# Patient Record
Sex: Female | Born: 1995 | Race: Black or African American | Hispanic: No | Marital: Single | Smoking: Never smoker
Health system: Southern US, Community
[De-identification: ages and names within clinical notes are randomized; demographics above are authoritative.]

## PROBLEM LIST (undated history)

## (undated) DIAGNOSIS — Z862 Personal history of diseases of the blood and blood-forming organs and certain disorders involving the immune mechanism: Secondary | ICD-10-CM

## (undated) DIAGNOSIS — R7303 Prediabetes: Secondary | ICD-10-CM

## (undated) DIAGNOSIS — L680 Hirsutism: Secondary | ICD-10-CM

## (undated) DIAGNOSIS — E282 Polycystic ovarian syndrome: Secondary | ICD-10-CM

## (undated) DIAGNOSIS — E349 Endocrine disorder, unspecified: Secondary | ICD-10-CM

## (undated) DIAGNOSIS — J45909 Unspecified asthma, uncomplicated: Secondary | ICD-10-CM

## (undated) DIAGNOSIS — N939 Abnormal uterine and vaginal bleeding, unspecified: Secondary | ICD-10-CM

## (undated) HISTORY — DX: Abnormal uterine and vaginal bleeding, unspecified: N93.9

## (undated) HISTORY — DX: Endocrine disorder, unspecified: E34.9

## (undated) HISTORY — DX: Hirsutism: L68.0

## (undated) HISTORY — DX: Personal history of diseases of the blood and blood-forming organs and certain disorders involving the immune mechanism: Z86.2

## (undated) HISTORY — DX: Polycystic ovarian syndrome: E28.2

## (undated) HISTORY — DX: Prediabetes: R73.03

---

## 2016-04-11 DIAGNOSIS — N930 Postcoital and contact bleeding: Secondary | ICD-10-CM | POA: Insufficient documentation

## 2016-04-11 DIAGNOSIS — N912 Amenorrhea, unspecified: Secondary | ICD-10-CM | POA: Insufficient documentation

## 2016-04-11 DIAGNOSIS — N760 Acute vaginitis: Secondary | ICD-10-CM | POA: Insufficient documentation

## 2016-04-12 DIAGNOSIS — E669 Obesity, unspecified: Secondary | ICD-10-CM | POA: Insufficient documentation

## 2016-04-29 DIAGNOSIS — E282 Polycystic ovarian syndrome: Secondary | ICD-10-CM | POA: Insufficient documentation

## 2016-11-30 ENCOUNTER — Emergency Department (HOSPITAL_COMMUNITY)
Admission: EM | Admit: 2016-11-30 | Discharge: 2016-11-30 | Disposition: A | Discharge status: Home / Self Care | Payer: Self-pay | Attending: Emergency Medicine | Admitting: Emergency Medicine

## 2016-11-30 ENCOUNTER — Emergency Department (HOSPITAL_COMMUNITY): Payer: Self-pay

## 2016-11-30 ENCOUNTER — Encounter (HOSPITAL_COMMUNITY): Payer: Self-pay | Admitting: Emergency Medicine

## 2016-11-30 DIAGNOSIS — F410 Panic disorder [episodic paroxysmal anxiety] without agoraphobia: Secondary | ICD-10-CM | POA: Insufficient documentation

## 2016-11-30 DIAGNOSIS — J45909 Unspecified asthma, uncomplicated: Secondary | ICD-10-CM | POA: Insufficient documentation

## 2016-11-30 DIAGNOSIS — Z79899 Other long term (current) drug therapy: Secondary | ICD-10-CM | POA: Insufficient documentation

## 2016-11-30 HISTORY — DX: Unspecified asthma, uncomplicated: J45.909

## 2016-11-30 LAB — CBC
HEMATOCRIT: 33.5 % — AB (ref 36.0–46.0)
HEMOGLOBIN: 10.4 g/dL — AB (ref 12.0–15.0)
MCH: 21.1 pg — AB (ref 26.0–34.0)
MCHC: 31 g/dL (ref 30.0–36.0)
MCV: 67.8 fL — AB (ref 78.0–100.0)
PLATELETS: 398 10*3/uL (ref 150–400)
RBC: 4.94 MIL/uL (ref 3.87–5.11)
RDW: 20.1 % — ABNORMAL HIGH (ref 11.5–15.5)
WBC: 7.9 10*3/uL (ref 4.0–10.5)

## 2016-11-30 LAB — BASIC METABOLIC PANEL
Anion gap: 11 (ref 5–15)
BUN: 11 mg/dL (ref 6–20)
CHLORIDE: 106 mmol/L (ref 101–111)
CO2: 22 mmol/L (ref 22–32)
CREATININE: 0.82 mg/dL (ref 0.44–1.00)
Calcium: 9.5 mg/dL (ref 8.9–10.3)
GFR calc non Af Amer: >60 mL/min (ref >60)
Glucose, Bld: 153 mg/dL — ABNORMAL HIGH (ref 65–99)
POTASSIUM: 2.9 mmol/L — AB (ref 3.5–5.1)
Sodium: 139 mmol/L (ref 135–145)

## 2016-11-30 LAB — I-STAT TROPONIN, ED: Troponin i, poc: 0 ng/mL (ref 0.00–0.08)

## 2016-11-30 MED ORDER — POTASSIUM CHLORIDE CRYS ER 20 MEQ PO TBCR
60.0000 meq | EXTENDED_RELEASE_TABLET | Freq: Once | ORAL | Status: AC
  Administered 2016-11-30: 60 meq via ORAL
  Filled 2016-11-30: qty 3

## 2016-11-30 MED ORDER — POTASSIUM CHLORIDE ER 10 MEQ PO TBCR: 10.0000 meq | EXTENDED_RELEASE_TABLET | Freq: Every day | ORAL | 0 refills | Status: DC

## 2016-11-30 NOTE — ED Provider Notes (Signed)
[facility]-EMER[facility]ENCY DEPT Provider Note   CSN: [id number] Arrival date & time: 11/30/16  0219   History   Chief Complaint Chief Complaint  Patient presents with  . Palpitations    HPI Tiffany Mason is a 21 y.o. female.  HPI   Patient to the ER with complaints of panic attack. It started approx 2 am after smoking marijuana. The episode lasted approximately an hour. She reported heart racing, sweating, difficulty controlling her breathing. She is unsure if the [facility] was laced with anything else. She did not get the drug from a trusted source. She denies having any syncope, CP, N/V/D, weakness, fevers, abdominal pains, back pains, SOB or any other associated symptoms. No SI/HI. Denies help with substance abuse. Denies hallucinations. Normal affect.   Past Medical History:  Diagnosis Date  . Asthma     There are no active problems to display for this patient.   History reviewed. No pertinent surgical history.  OB History    No data available       Home Medications    Prior to Admission medications   Medication Sig Start Date End Date Taking? Authorizing Provider  potassium chloride (K-DUR) 10 MEQ tablet Take 1 tablet (10 mEq total) by mouth daily. 11/30/16   Marlon Peliffany Krishav Mamone, PA-C    Family History No family history on file.  Social History Social History  Substance Use Topics  . Smoking status: Never Smoker  . Smokeless tobacco: Never Used  . Alcohol use Not on file     Allergies   Patient has no known allergies.   Review of Systems Review of Systems Review of Systems All other systems negative except as documented in the HPI. All pertinent positives and negatives as reviewed in the HPI.   Physical Exam Updated Vital Signs BP 127/85   Pulse 110   Temp 98.6 F (37 C) (Oral)   Resp 17   LMP  (LMP Unknown)   SpO2 98%   Physical Exam  Constitutional: She appears well-developed and well-nourished.  HENT:  Head: Normocephalic and atraumatic.  Eyes:  Conjunctivae are normal. Pupils are equal, round, and reactive to light.  Neck: Trachea normal, normal range of motion and full passive range of motion without pain. Neck supple.  Cardiovascular: Normal rate, regular rhythm and normal pulses.   Pulmonary/Chest: Effort normal and breath sounds normal. Chest wall is not dull to percussion. She exhibits no tenderness, no crepitus, no edema, no deformity and no retraction.  Abdominal: Soft. Normal appearance and bowel sounds are normal.  Musculoskeletal: Normal range of motion.  Neurological: She is alert. She has normal strength.  Skin: Skin is warm, dry and intact.  Psychiatric: She has a normal mood and affect. Her speech is normal and behavior is normal. Judgment and thought content normal. Cognition and memory are normal.     ED Treatments / Results  Labs (all labs ordered are listed, but only abnormal results are displayed) Labs Reviewed  BASIC METABOLIC PANEL - Abnormal; Notable for the following:       Result Value   Potassium 2.9 (*)    Glucose, Bld 153 (*)    All other components within normal limits  CBC - Abnormal; Notable for the following:    Hemoglobin 10.4 (*)    HCT 33.5 (*)    MCV 67.8 (*)    MCH 21.1 (*)    RDW 20.1 (*)    All other components within normal limits  I-STAT TROPOININ, ED    EKG  EKG Interpretation None       Radiology Dg Chest 2 View  Result Date: 11/30/2016 CLINICAL DATA:  Heart palpitations today.  Smoker. EXAM: CHEST  2 VIEW COMPARISON:  None. FINDINGS: The heart size and mediastinal contours are within normal limits. Both lungs are clear. The visualized skeletal structures are unremarkable. IMPRESSION: No active cardiopulmonary disease. Electronically Signed   By: Burman Nieves M.D.   On: 11/30/2016 03:33    Procedures Procedures (including critical care time)  Medications Ordered in ED Medications  potassium chloride SA (K-DUR,KLOR-CON) CR tablet 60 mEq (not administered)      Initial Impression / Assessment and Plan / ED Course  I have reviewed the triage vital signs and the nursing notes.  Pertinent labs & imaging results that were available during my care of the patient were reviewed by me and considered in my medical decision making (see chart for details).  Clinical Course    DEBARA, KAMPHUIS [id number] 30-Nov-2016 02:41:50 [facility]ED ROUTINE RECORD Sinus tachycardia T wave abnormality, consider inferior ischemia Abnormal ECG 47mm/s 70mm/mV 100Hz  9.0.4 12SL 241 CID: 45 Unconfirmed Vent. rate 141 BPM PR interval * ms QRS duration 74 ms QT/QTc 362/554 ms P-R-T axes 29 87   Patient had an episode of heart racing and panic after smoking TCH. It resolved on it own after an hour. She did not otherwise have any concerning symptoms. On arrival she was tachycardic but her pulses are now normal and she is resting calmly. She denied having any  Si/ HI, CP, SOB. Advised that smoking marijuana is illegal and not recommended. In room patients pulse is 86. I will ask nurse to document this as well.  I discussed results, diagnoses and plan with Tiffany Mason. They voice there understanding and questions were answered. We discussed follow-up recommendations and return precautions.   Final Clinical Impressions(s) / ED Diagnoses   Final diagnoses:  Panic attack    New Prescriptions New Prescriptions   POTASSIUM CHLORIDE (K-DUR) 10 MEQ TABLET    Take 1 tablet (10 mEq total) by mouth daily.     Marlon Pel, PA-C 11/30/16 0636    Layla Maw Ward, DO 11/30/16 9811

## 2016-11-30 NOTE — ED Triage Notes (Signed)
Patient with racing heart rate.  She admits to having THC tonight.  She is CAOx4 at this time.  No nausea or vomiting.  No shortness of breath.

## 2017-01-29 DIAGNOSIS — N63 Unspecified lump in unspecified breast: Secondary | ICD-10-CM | POA: Diagnosis not present

## 2017-02-03 ENCOUNTER — Encounter (HOSPITAL_COMMUNITY): Payer: Self-pay | Admitting: *Deleted

## 2017-02-03 ENCOUNTER — Emergency Department (HOSPITAL_COMMUNITY)
Admission: EM | Admit: 2017-02-03 | Discharge: 2017-02-03 | Disposition: A | Discharge status: Home / Self Care | Payer: Self-pay | Attending: Emergency Medicine | Admitting: Emergency Medicine

## 2017-02-03 DIAGNOSIS — J45909 Unspecified asthma, uncomplicated: Secondary | ICD-10-CM | POA: Insufficient documentation

## 2017-02-03 DIAGNOSIS — Z5321 Procedure and treatment not carried out due to patient leaving prior to being seen by health care provider: Secondary | ICD-10-CM | POA: Insufficient documentation

## 2017-02-03 DIAGNOSIS — N649 Disorder of breast, unspecified: Secondary | ICD-10-CM | POA: Insufficient documentation

## 2017-02-03 NOTE — ED Triage Notes (Signed)
Pt was feeling her breast when she noted some lumps.  No pain in her breast.  Pt can express a small amount of fluid (looks like milk).  Pt has never been pregnant before.  No pain with this.

## 2017-02-13 ENCOUNTER — Other Ambulatory Visit: Payer: Self-pay | Admitting: Family Medicine

## 2017-02-13 DIAGNOSIS — N63 Unspecified lump in unspecified breast: Secondary | ICD-10-CM

## 2017-02-14 ENCOUNTER — Other Ambulatory Visit: Payer: Self-pay | Admitting: Family Medicine

## 2017-02-14 DIAGNOSIS — N6311 Unspecified lump in the right breast, upper outer quadrant: Secondary | ICD-10-CM | POA: Diagnosis not present

## 2017-02-14 DIAGNOSIS — N6320 Unspecified lump in the left breast, unspecified quadrant: Secondary | ICD-10-CM

## 2017-02-14 DIAGNOSIS — N6310 Unspecified lump in the right breast, unspecified quadrant: Secondary | ICD-10-CM

## 2017-02-19 ENCOUNTER — Ambulatory Visit
Admission: RE | Admit: 2017-02-19 | Discharge: 2017-02-19 | Disposition: A | Discharge status: Home / Self Care | Payer: BLUE CROSS/BLUE SHIELD | Source: Ambulatory Visit | Attending: Family Medicine | Admitting: Family Medicine

## 2017-02-19 DIAGNOSIS — N6310 Unspecified lump in the right breast, unspecified quadrant: Secondary | ICD-10-CM

## 2017-02-19 DIAGNOSIS — N6489 Other specified disorders of breast: Secondary | ICD-10-CM | POA: Diagnosis not present

## 2017-02-19 DIAGNOSIS — N6320 Unspecified lump in the left breast, unspecified quadrant: Secondary | ICD-10-CM

## 2017-04-21 DIAGNOSIS — E282 Polycystic ovarian syndrome: Secondary | ICD-10-CM | POA: Diagnosis not present

## 2017-04-21 DIAGNOSIS — N61 Mastitis without abscess: Secondary | ICD-10-CM | POA: Diagnosis not present

## 2017-04-21 DIAGNOSIS — N6459 Other signs and symptoms in breast: Secondary | ICD-10-CM | POA: Diagnosis not present

## 2017-04-21 DIAGNOSIS — N6310 Unspecified lump in the right breast, unspecified quadrant: Secondary | ICD-10-CM | POA: Diagnosis not present

## 2017-04-21 DIAGNOSIS — Z1389 Encounter for screening for other disorder: Secondary | ICD-10-CM | POA: Diagnosis not present

## 2017-04-23 ENCOUNTER — Other Ambulatory Visit: Payer: Self-pay | Admitting: Family Medicine

## 2017-04-23 DIAGNOSIS — N6310 Unspecified lump in the right breast, unspecified quadrant: Secondary | ICD-10-CM

## 2017-05-05 ENCOUNTER — Ambulatory Visit
Admission: RE | Admit: 2017-05-05 | Discharge: 2017-05-05 | Disposition: A | Discharge status: Home / Self Care | Payer: BLUE CROSS/BLUE SHIELD | Source: Ambulatory Visit | Attending: Family Medicine | Admitting: Family Medicine

## 2017-05-05 DIAGNOSIS — N6310 Unspecified lump in the right breast, unspecified quadrant: Secondary | ICD-10-CM

## 2017-05-05 DIAGNOSIS — N6320 Unspecified lump in the left breast, unspecified quadrant: Secondary | ICD-10-CM | POA: Diagnosis not present

## 2017-07-14 DIAGNOSIS — J069 Acute upper respiratory infection, unspecified: Secondary | ICD-10-CM | POA: Diagnosis not present

## 2017-08-12 DIAGNOSIS — R51 Headache: Secondary | ICD-10-CM | POA: Diagnosis not present

## 2017-10-14 IMAGING — US ULTRASOUND LEFT BREAST LIMITED
1 series · 2 of 2 positions shown · non-contrast
Comparison: None.

CLINICAL DATA: 21-year-old female presenting for evaluation of a
palpable lump in the left breast which is tender with palpation. She
has lumpiness in the breasts bilaterally, however she cannot
identify a mass of concern in the right breast. She also says she
has had bilateral milky nipple discharge only with expression. She
has been diagnosed with PCOS.

EXAM:
ULTRASOUND OF THE LEFT BREAST

[Series 1: ultrasound left breast limited · 0.06mm/px · 2 of 2 slices shown]
[im 1/2]
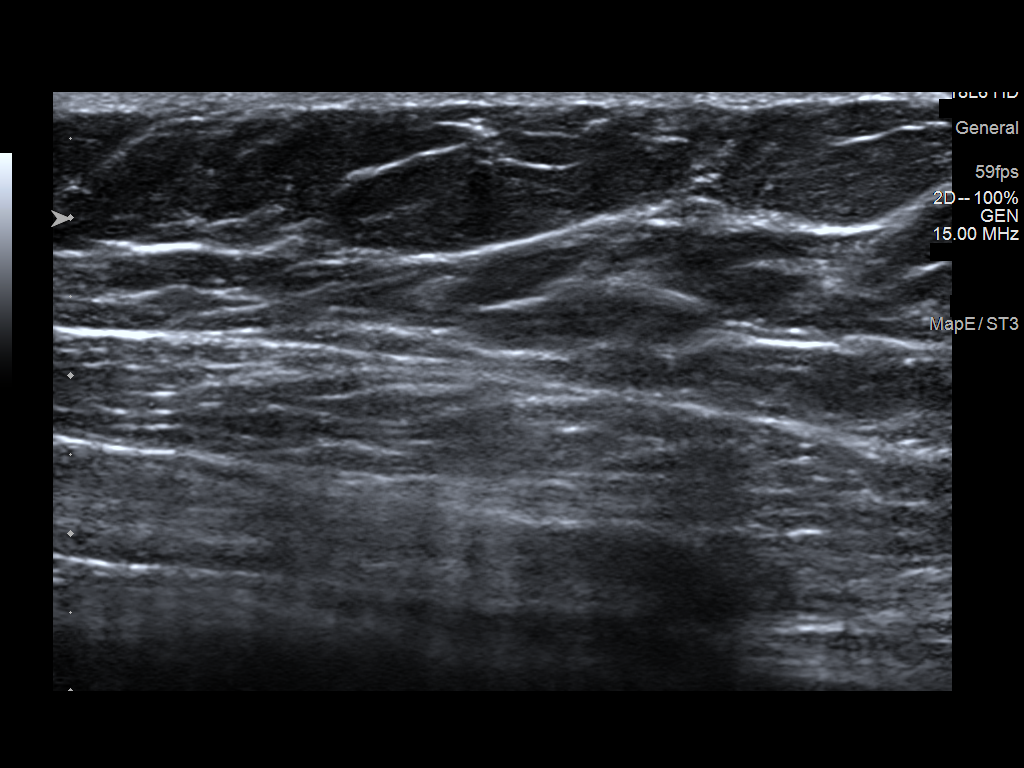
[im 2/2]
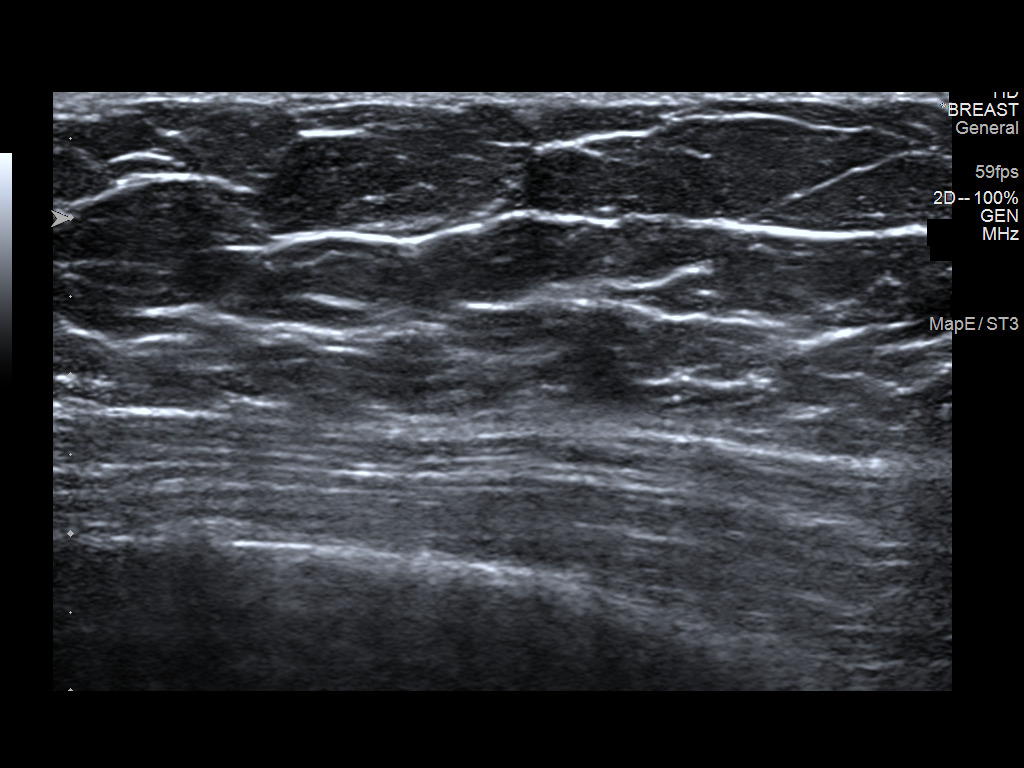

[2 of 2 positions shown; findings below may reference images not displayed]

FINDINGS: On physical exam, there is a firm ridge of tissue in the upper inner
left breast at the palpable site of concern, though no discrete mass
is palpated. A tiny amount of milky nipple discharge can be elicited
with expression from the left nipple.

Targeted ultrasound is performed, showing normal fibroglandular
tissue. No masses or suspicious areas of shadowing are identified in
the upper inner left breast.
IMPRESSION: 1. There is no targeted sonographic abnormality at the palpable site
of concern in the upper inner left breast.

2. The description in appearance of the nipple discharge is benign
as it is bilateral, milky, and non spontaneous.

RECOMMENDATION:
1. Clinical follow-up recommended for the palpable area of concern
in the left breast. Any further workup should be based on clinical
grounds. Clinical follow-up also recommended for the benign
bilateral nipple discharge.

I have discussed the findings and recommendations with the patient.
Results were also provided in writing at the conclusion of the
visit. If applicable, a reminder letter will be sent to the patient
regarding the next appointment.

BI-RADS CATEGORY  1: Negative.

## 2018-02-24 DIAGNOSIS — K529 Noninfective gastroenteritis and colitis, unspecified: Secondary | ICD-10-CM | POA: Diagnosis not present

## 2018-06-03 NOTE — Progress Notes (Signed)
22 y.o. G0P0 SingleAfrican AmericanF here for annual exam.  Wants to discuss having an IUD placed. Menarche age 62, always irregular. When she was younger she would skip months at a time. For the last 8-9 months she is almost constantly bleeding. The longest she has gone without bleeding in the last few months is a week. Bleeding through a pad an hour at times, other times just needs a panty liner.  Was told she had PCOS 2 years ago, had an ultrasound at that time.  In the past she tried the pill for a couple of months, didn't like it.  She c/o long term hair growth on her face, needs to shave. Has hair on her chest and abdomen. No acne. No weight changes.  Period Duration (Days): reports bleeding for weeks at a time, unsure of exact lengeth Period Pattern: (!) Irregular Menstrual Flow: Heavy Menstrual Control: Maxi pad, Thin pad Menstrual Control Change Freq (Hours): changes pad every hour Dysmenorrhea: (!) Moderate Dysmenorrhea Symptoms: Cramping  Sexually active, same partner x 6 months. Uses condoms, no dyspareunia.   Patient's last menstrual period was 05/30/2018 (approximate).          Sexually active: Yes.    The current method of family planning is condoms most of the time.    Exercising: No.  The patient does not participate in regular exercise at present. Smoker:  no  Health Maintenance: Pap:  2017 WNL per patient History of abnormal Pap:  no TDaP:  Up to date per patient Gardasil: Unsure   reports that she has never smoked. She has never used smokeless tobacco. She reports that she drank alcohol. She reports that she has current or past drug history. No ETOH. 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   Past Medical History:  Diagnosis Date  . Abnormal uterine bleeding   . Asthma   . 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 Has never had a genital outbreak.   History reviewed. No pertinent surgical history.  Current Outpatient  Medications  Medication Sig Dispense Refill  . valACYclovir (VALTREX) 500 MG tablet Take 500 mg by mouth daily.     No current facility-administered medications for this visit.     Family History  Problem Relation Age of Onset  . Diabetes Father     Review of Systems  Constitutional: Negative.   HENT: Negative.   Eyes: Negative.   Respiratory: Negative.   Cardiovascular: Negative.   Gastrointestinal: Negative.   Endocrine: Negative.   Genitourinary: Positive for vaginal bleeding.  Musculoskeletal: Negative.   Skin: Negative.   Allergic/Immunologic: Negative.   Neurological: Negative.   Hematological: Negative.   Psychiatric/Behavioral: Negative.     Exam:   BP 114/64 (BP Location: Right Arm, Patient Position: Sitting)   Pulse 76   Ht 5' 5.75" (1.67 m)   Wt 230 lb (104.3 kg)   LMP 05/30/2018 (Approximate)   BMI 37.41 kg/m   Weight change: @WEIGHTCHANGE @ Height:   Height: 5' 5.75" (167 cm)  Ht Readings from Last 3 Encounters:  06/04/18 5' 5.75" (1.67 m)    General appearance: alert, cooperative and appears stated age Head: Normocephalic, without obvious abnormality, atraumatic Neck: no adenopathy, supple, symmetrical, trachea midline and thyroid normal to inspection and palpation Lungs: clear to auscultation bilaterally Cardiovascular: regular rate and rhythm Breasts: normal appearance, no masses or tenderness Abdomen: soft, non-tender; non distended,  no masses,  no organomegaly Extremities: extremities normal, atraumatic, no cyanosis or edema  Skin: large erythematous rash under and between both breasts. Extensive hair growth on her chin and lower abdomen.  Lymph nodes: Cervical, supraclavicular, and axillary nodes normal. No abnormal inguinal nodes palpated Neurologic: Grossly normal   Pelvic: External genitalia:  no lesions              Urethra:  normal appearing urethra with no masses, tenderness or lesions              Bartholins and Skenes: normal                  Vagina: normal appearing vagina with normal color and discharge, no lesions              Cervix: no cervical motion tenderness and no lesions               Bimanual Exam:  Uterus:  no masses or tenderness              Adnexa: no mass, fullness, tenderness               Rectovaginal: Confirms               Anus:  normal sphincter tone, no lesions  Chaperone was present for exam.  A:  Well Woman with normal exam  Hirsutism  Elevated BMI  FH of DM  Menometrorrhagia  Anovuation  PCOS  Contraception  P:   BhcG, TSH, Prolactin, HgbA1C, hirsutism labs, screening labs  STD testing  Pap   If BhcG is negative, start provera 10 mg x 10 days  Discussed options for contraception, she desires Mirena, discussed risks and side effects. It will also give her endometrial protection  Plan u/s and mirena IUD insertion after provera W/D  Discussed breast self awareness  Discussed the importance of regular cycles or endometrial protection. Discussed the risk of endometrial cancer with long term unopposed estrogen

## 2018-06-04 ENCOUNTER — Encounter: Payer: Self-pay | Admitting: Obstetrics and Gynecology

## 2018-06-04 ENCOUNTER — Other Ambulatory Visit (HOSPITAL_COMMUNITY)
Admission: RE | Admit: 2018-06-04 | Discharge: 2018-06-04 | Disposition: A | Discharge status: Home / Self Care | Payer: BLUE CROSS/BLUE SHIELD | Source: Ambulatory Visit | Attending: Obstetrics and Gynecology | Admitting: Obstetrics and Gynecology

## 2018-06-04 ENCOUNTER — Ambulatory Visit (INDEPENDENT_AMBULATORY_CARE_PROVIDER_SITE_OTHER): Payer: BLUE CROSS/BLUE SHIELD | Admitting: Obstetrics and Gynecology

## 2018-06-04 ENCOUNTER — Other Ambulatory Visit: Payer: Self-pay

## 2018-06-04 ENCOUNTER — Other Ambulatory Visit: Payer: Self-pay | Admitting: Obstetrics and Gynecology

## 2018-06-04 VITALS — BP 114/64 | HR 76 | Ht 65.75 in | Wt 230.0 lb

## 2018-06-04 DIAGNOSIS — N970 Female infertility associated with anovulation: Secondary | ICD-10-CM

## 2018-06-04 DIAGNOSIS — B372 Candidiasis of skin and nail: Secondary | ICD-10-CM

## 2018-06-04 DIAGNOSIS — Z833 Family history of diabetes mellitus: Secondary | ICD-10-CM | POA: Diagnosis not present

## 2018-06-04 DIAGNOSIS — Z124 Encounter for screening for malignant neoplasm of cervix: Secondary | ICD-10-CM | POA: Diagnosis not present

## 2018-06-04 DIAGNOSIS — L680 Hirsutism: Secondary | ICD-10-CM | POA: Diagnosis not present

## 2018-06-04 DIAGNOSIS — IMO0001 Reserved for inherently not codable concepts without codable children: Secondary | ICD-10-CM

## 2018-06-04 DIAGNOSIS — Z3009 Encounter for other general counseling and advice on contraception: Secondary | ICD-10-CM

## 2018-06-04 DIAGNOSIS — E282 Polycystic ovarian syndrome: Secondary | ICD-10-CM

## 2018-06-04 DIAGNOSIS — Z6837 Body mass index (BMI) 37.0-37.9, adult: Secondary | ICD-10-CM | POA: Diagnosis not present

## 2018-06-04 DIAGNOSIS — Z113 Encounter for screening for infections with a predominantly sexual mode of transmission: Secondary | ICD-10-CM

## 2018-06-04 DIAGNOSIS — Z0000 Encounter for general adult medical examination without abnormal findings: Secondary | ICD-10-CM | POA: Diagnosis not present

## 2018-06-04 DIAGNOSIS — Z789 Other specified health status: Secondary | ICD-10-CM

## 2018-06-04 DIAGNOSIS — N921 Excessive and frequent menstruation with irregular cycle: Secondary | ICD-10-CM

## 2018-06-04 DIAGNOSIS — Z01419 Encounter for gynecological examination (general) (routine) without abnormal findings: Secondary | ICD-10-CM

## 2018-06-04 MED ORDER — MEDROXYPROGESTERONE ACETATE 10 MG PO TABS: 10.0000 mg | ORAL_TABLET | Freq: Every day | ORAL | 0 refills | Status: DC

## 2018-06-04 MED ORDER — VALACYCLOVIR HCL 500 MG PO TABS: 500.0000 mg | ORAL_TABLET | Freq: Every day | ORAL | 11 refills | Status: DC

## 2018-06-04 MED ORDER — NYSTATIN 100000 UNIT/GM EX OINT: 1.0000 "application " | TOPICAL_OINTMENT | Freq: Two times a day (BID) | CUTANEOUS | 0 refills | Status: DC

## 2018-06-04 NOTE — Telephone Encounter (Signed)
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 Confirmed pharmacy. Seen in office today for AEX.   RX pended.   Dr. Dr. Oscar LaJertson -please advise on refill.

## 2018-06-04 NOTE — Telephone Encounter (Signed)
Patient is asking for a new prescription for valacyclovir to be called to the pharmacy on file. Patient was seen today and forgot to call for this prescription.

## 2018-06-04 NOTE — Patient Instructions (Addendum)
Check on gardasil.   EXERCISE AND DIET:  We recommended that you start or continue a regular exercise program for good health. Regular exercise means any activity that makes your heart beat faster and makes you sweat.  We recommend exercising at least 30 minutes per day at least 3 days a week, preferably 4 or 5.  We also recommend a diet low in fat and sugar.  Inactivity, poor dietary choices and obesity can cause diabetes, heart attack, stroke, and kidney damage, among others.    ALCOHOL AND SMOKING:  Women should limit their alcohol intake to no more than 7 drinks/beers/glasses of wine (combined, not each!) per week. Moderation of alcohol intake to this level decreases your risk of breast cancer and liver damage. And of course, no recreational drugs are part of a healthy lifestyle.  And absolutely no smoking or even second hand smoke. Most people know smoking can cause heart and lung diseases, but did you know it also contributes to weakening of your bones? Aging of your skin?  Yellowing of your teeth and nails?  CALCIUM AND VITAMIN D:  Adequate intake of calcium and Vitamin D are recommended.  The recommendations for exact amounts of these supplements seem to change often, but generally speaking 600 mg of calcium (either carbonate or citrate) and 800 units of Vitamin D per day seems prudent. Certain women may benefit from higher intake of Vitamin D.  If you are among these women, your doctor will have told you during your visit.    PAP SMEARS:  Pap smears, to check for cervical cancer or precancers,  have traditionally been done yearly, although recent scientific advances have shown that most women can have pap smears less often.  However, every woman still should have a physical exam from her gynecologist every year. It will include a breast check, inspection of the vulva and vagina to check for abnormal growths or skin changes, a visual exam of the cervix, and then an exam to evaluate the size and shape  of the uterus and ovaries.  And after 23 years of age, a rectal exam is indicated to check for rectal cancers. We will also provide age appropriate advice regarding health maintenance, like when you should have certain vaccines, screening for sexually transmitted diseases, bone density testing, colonoscopy, mammograms, etc.   MAMMOGRAMS:  All women over 23 years old should have a yearly mammogram. Many facilities now offer a "3D" mammogram, which may cost around $50 extra out of pocket. If possible,  we recommend you accept the option to have the 3D mammogram performed.  It both reduces the number of women who will be called back for extra views which then turn out to be normal, and it is better than the routine mammogram at detecting truly abnormal areas.    COLONOSCOPY:  Colonoscopy to screen for colon cancer is recommended for all women at age 23.  We know, you hate the idea of the prep.  We agree, BUT, having colon cancer and not knowing it is worse!!  Colon cancer so often starts as a polyp that can be seen and removed at colonscopy, which can quite literally save your life!  And if your first colonoscopy is normal and you have no family history of colon cancer, most women don't have to have it again for 10 years.  Once every ten years, you can do something that may end up saving your life, right?  We will be happy to help you get it  scheduled when you are ready.  Be sure to check your insurance coverage so you understand how much it will cost.  It may be covered as a preventative service at no cost, but you should check your particular policy.      Breast Self-Awareness Breast self-awareness means being familiar with how your breasts look and feel. It involves checking your breasts regularly and reporting any changes to your health care provider. Practicing breast self-awareness is important. A change in your breasts can be a sign of a serious medical problem. Being familiar with how your breasts  look and feel allows you to find any problems early, when treatment is more likely to be successful. All women should practice breast self-awareness, including women who have had breast implants. How to do a breast self-exam One way to learn what is normal for your breasts and whether your breasts are changing is to do a breast self-exam. To do a breast self-exam: Look for Changes  1. Remove all the clothing above your waist. 2. Stand in front of a mirror in a room with good lighting. 3. Put your hands on your hips. 4. Push your hands firmly downward. 5. Compare your breasts in the mirror. Look for differences between them (asymmetry), such as: ? Differences in shape. ? Differences in size. ? Puckers, dips, and bumps in one breast and not the other. 6. Look at each breast for changes in your skin, such as: ? Redness. ? Scaly areas. 7. Look for changes in your nipples, such as: ? Discharge. ? Bleeding. ? Dimpling. ? Redness. ? A change in position. Feel for Changes  Carefully feel your breasts for lumps and changes. It is best to do this while lying on your back on the floor and again while sitting or standing in the shower or tub with soapy water on your skin. Feel each breast in the following way:  Place the arm on the side of the breast you are examining above your head.  Feel your breast with the other hand.  Start in the nipple area and make  inch (2 cm) overlapping circles to feel your breast. Use the pads of your three middle fingers to do this. Apply light pressure, then medium pressure, then firm pressure. The light pressure will allow you to feel the tissue closest to the skin. The medium pressure will allow you to feel the tissue that is a little deeper. The firm pressure will allow you to feel the tissue close to the ribs.  Continue the overlapping circles, moving downward over the breast until you feel your ribs below your breast.  Move one finger-width toward the  center of the body. Continue to use the  inch (2 cm) overlapping circles to feel your breast as you move slowly up toward your collarbone.  Continue the up and down exam using all three pressures until you reach your armpit.  Write Down What You Find  Write down what is normal for each breast and any changes that you find. Keep a written record with breast changes or normal findings for each breast. By writing this information down, you do not need to depend only on memory for size, tenderness, or location. Write down where you are in your menstrual cycle, if you are still menstruating. If you are having trouble noticing differences in your breasts, do not get discouraged. With time you will become more familiar with the variations in your breasts and more comfortable with the exam. How often  should I examine my breasts? Examine your breasts every month. If you are breastfeeding, the best time to examine your breasts is after a feeding or after using a breast pump. If you menstruate, the best time to examine your breasts is 5-7 days after your period is over. During your period, your breasts are lumpier, and it may be more difficult to notice changes. When should I see my health care provider? See your health care provider if you notice:  A change in shape or size of your breasts or nipples.  A change in the skin of your breast or nipples, such as a reddened or scaly area.  Unusual discharge from your nipples.  A lump or thick area that was not there before.  Pain in your breasts.  Anything that concerns you.  This information is not intended to replace advice given to you by your health care provider. Make sure you discuss any questions you have with your health care provider. Document Released: 11/04/2005 Document Revised: 04/11/2016 Document Reviewed: 09/24/2015 Elsevier Interactive Patient Education  [employer].

## 2018-06-05 ENCOUNTER — Other Ambulatory Visit: Payer: Self-pay | Admitting: *Deleted

## 2018-06-05 DIAGNOSIS — Z30014 Encounter for initial prescription of intrauterine contraceptive device: Secondary | ICD-10-CM

## 2018-06-05 DIAGNOSIS — E7800 Pure hypercholesterolemia, unspecified: Secondary | ICD-10-CM

## 2018-06-05 DIAGNOSIS — R7303 Prediabetes: Secondary | ICD-10-CM

## 2018-06-06 LAB — HEMOGLOBIN A1C
ESTIMATED AVERAGE GLUCOSE: 128 mg/dL
HEMOGLOBIN A1C: 6.1 % — AB (ref 4.8–5.6)

## 2018-06-06 LAB — HEP, RPR, HIV PANEL
HIV SCREEN 4TH GENERATION: NONREACTIVE
Hepatitis B Surface Ag: NEGATIVE
RPR Ser Ql: NONREACTIVE

## 2018-06-06 LAB — CBC
HEMATOCRIT: 32.2 % — AB (ref 34.0–46.6)
Hemoglobin: 9.2 g/dL — ABNORMAL LOW (ref 11.1–15.9)
MCH: 19.1 pg — ABNORMAL LOW (ref 26.6–33.0)
MCHC: 28.6 g/dL — AB (ref 31.5–35.7)
MCV: 67 fL — ABNORMAL LOW (ref 79–97)
PLATELETS: 373 10*3/uL (ref 150–450)
RBC: 4.82 x10E6/uL (ref 3.77–5.28)
RDW: 21.4 % — AB (ref 12.3–15.4)
WBC: 5.4 10*3/uL (ref 3.4–10.8)

## 2018-06-06 LAB — COMPREHENSIVE METABOLIC PANEL
ALK PHOS: 66 IU/L (ref 39–117)
ALT: 18 IU/L (ref 0–32)
AST: 18 IU/L (ref 0–40)
Albumin/Globulin Ratio: 1.5 (ref 1.2–2.2)
Albumin: 4.1 g/dL (ref 3.5–5.5)
BUN/Creatinine Ratio: 21 (ref 9–23)
BUN: 14 mg/dL (ref 6–20)
Bilirubin Total: <0.2 mg/dL (ref 0.0–1.2)
CALCIUM: 9.1 mg/dL (ref 8.7–10.2)
CO2: 25 mmol/L (ref 20–29)
CREATININE: 0.68 mg/dL (ref 0.57–1.00)
Chloride: 108 mmol/L — ABNORMAL HIGH (ref 96–106)
GFR calc Af Amer: 143 mL/min/{1.73_m2} (ref >59)
GFR, EST NON AFRICAN AMERICAN: 124 mL/min/{1.73_m2} (ref >59)
GLOBULIN, TOTAL: 2.8 g/dL (ref 1.5–4.5)
GLUCOSE: 96 mg/dL (ref 65–99)
Potassium: 4.2 mmol/L (ref 3.5–5.2)
SODIUM: 144 mmol/L (ref 134–144)
Total Protein: 6.9 g/dL (ref 6.0–8.5)

## 2018-06-06 LAB — LIPID PANEL
CHOLESTEROL TOTAL: 198 mg/dL (ref 100–199)
Chol/HDL Ratio: 4 ratio (ref 0.0–4.4)
HDL: 50 mg/dL (ref >39)
LDL Calculated: 131 mg/dL — ABNORMAL HIGH (ref 0–99)
Triglycerides: 86 mg/dL (ref 0–149)
VLDL CHOLESTEROL CAL: 17 mg/dL (ref 5–40)

## 2018-06-06 LAB — 17-HYDROXYPROGESTERONE: 17 HYDROXYPROGESTERONE: 48 ng/dL

## 2018-06-06 LAB — PROLACTIN: PROLACTIN: 12.9 ng/mL (ref 4.8–23.3)

## 2018-06-06 LAB — HEPATITIS C ANTIBODY

## 2018-06-06 LAB — TESTOSTERONE: TESTOSTERONE: 40 ng/dL (ref 8–48)

## 2018-06-06 LAB — TSH: TSH: 2.08 u[IU]/mL (ref 0.450–4.500)

## 2018-06-06 LAB — DHEA-SULFATE: DHEA-SO4: 344.7 ug/dL (ref 110.0–431.7)

## 2018-06-06 LAB — BETA HCG QUANT (REF LAB): hCG Quant: <1 m[IU]/mL

## 2018-06-06 LAB — FERRITIN: FERRITIN: 8 ng/mL — AB (ref 15–150)

## 2018-06-08 LAB — CYTOLOGY - PAP
Chlamydia: NEGATIVE
Diagnosis: NEGATIVE
Neisseria Gonorrhea: NEGATIVE

## 2018-06-11 ENCOUNTER — Telehealth: Payer: Self-pay | Admitting: Obstetrics and Gynecology

## 2018-06-11 NOTE — Telephone Encounter (Signed)
Called patient to review benefits for a recommended ultrasound and IUD insertion on same day. Dr Oscar LaJertson requested to schedule patient the week of 06/22/18. Due to limited appointment availability, I have scheduled patient on 06/23/18 to ensure she has an appointment. I have left a voicemail message requesting a return call in order to convey this information.     cc: Lance BoschAndra Alexander

## 2018-06-15 NOTE — Telephone Encounter (Signed)
Per staff message received by Soledad GerlachAndrea Alexander, she spoke with on 06/12/18. Patient is agreeable to 06/23/18 appointment with Dr Oscar LaJertson for an IUD insertion and an ultrasound.

## 2018-06-22 NOTE — Progress Notes (Signed)
GYNECOLOGY  VISIT   HPI: 22 y.o.   Single  African American  female   G0P0000 with Patient's last menstrual period was 06/11/2018 (exact date).   here for consult after PUS. The patient has been bleeding for most of the last 9 months. She was treated with provera, finished in the last week or so. Never stopped bleeding with the provera, but spotted. After finishing the provera her bleeding got very heavy, currently around the 10th day of her cycle. She is saturating a pad in up to an hour, bad cramps. Occasionally feel light head, hasn't gotten to the point of feeling like she was going to pass out. Not short of breath.  Hgb was 9.2 on 7/18. Hasn't started the iron yet.  She wants to start on OCP's Recent pap with BV, she does note a d/c with an odor.   GYNECOLOGIC HISTORY: Patient's last menstrual period was 06/11/2018 (exact date). Contraception: Condoms Menopausal hormone therapy: None                OB History    Gravida  0   Para  0   Term  0   Preterm  0   AB  0   Living  0     SAB  0   TAB  0   Ectopic  0   Multiple  0   Live Births  0                  Patient Active Problem List   Diagnosis Date Noted  . Polycystic ovaries 04/29/2016  . Obesity 04/12/2016  . Amenorrhea 04/11/2016  . Postcoital bleeding 04/11/2016  . Vaginitis 04/11/2016        Past Medical History:  Diagnosis Date  . Abnormal uterine bleeding   . Asthma   . Hormone disorder     History reviewed. No pertinent surgical history.        Current Outpatient Medications  Medication Sig Dispense Refill  . nystatin ointment (MYCOSTATIN) Apply 1 application topically 2 (two) times daily. Apply to affected area for up to 7 days. 30 g 0  . valACYclovir (VALTREX) 500 MG tablet Take 1 tablet (500 mg total) by mouth daily. 30 tablet 11   No current facility-administered medications for this visit.      ALLERGIES: Patient has no known allergies.       Family  History  Problem Relation Age of Onset  . Diabetes Father     Social History        Socioeconomic History  . Marital status: Single    Spouse name: Not on file  . Number of children: Not on file  . Years of education: Not on file  . Highest education level: Not on file  Occupational History  . Not on file  Social Needs  . Financial resource strain: Not on file  . Food insecurity:    Worry: Not on file    Inability: Not on file  . Transportation needs:    Medical: Not on file    Non-medical: Not on file  Tobacco Use  . Smoking status: Never Smoker  . Smokeless tobacco: Never Used  Substance and Sexual Activity  . Alcohol use: Not Currently  . Drug use: Not Currently  . Sexual activity: Yes    Birth control/protection: Condom  Lifestyle  . Physical activity:    Days per week: Not on file    Minutes per session: Not on file  .   Stress: Not on file  Relationships  . Social connections:    Talks on phone: Not on file    Gets together: Not on file    Attends religious service: Not on file    Active member of club or organization: Not on file    Attends meetings of clubs or organizations: Not on file    Relationship status: Not on file  . Intimate partner violence:    Fear of current or ex partner: Not on file    Emotionally abused: Not on file    Physically abused: Not on file    Forced sexual activity: Not on file  Other Topics Concern  . Not on file  Social History Narrative  . Not on file    Review of Systems  Constitutional: Negative.   HENT: Negative.   Eyes: Negative.   Respiratory: Negative.   Cardiovascular: Negative.   Gastrointestinal:       Bloating  Genitourinary:       Excess bleeding Painful menses AUB Pain and bleeding with intercourse  Musculoskeletal: Negative.   Skin:       Hair growth  Neurological: Positive for headaches.  Endo/Heme/Allergies: Negative.   Psychiatric/Behavioral: Negative.      PHYSICAL EXAMINATION:    BP 124/68 (BP Location: Right Arm, Patient Position: Sitting)   Pulse 64   Wt 229 lb (103.9 kg)   LMP 06/11/2018 (Exact Date)   BMI 37.25 kg/m     General appearance: alert, cooperative and appears stated age  ASSESSMENT Menometrorrhagia, c/w anovulatory bleeding. Heavy bleed s/p provera, still bleeding H/O anemia BV on pap, + symptoms    PLAN Start OCPs, risks reviewed, no contraindications Not sexually active since prior to the provera and her negative hcG.  Start flagyl, no ETOH Start ferrex 150 mg BID CBC, ferritin   An After Visit Summary was printed and given to the patient.  ~25 minutes face to face time of which over 50% was spent in counseling.   Addendum: HGB 6.5 Discussed patient's hgb and continued active bleeding with the patient. Recommended she have an office D&C and then go to [facility] for a blood transfusion.  Pelvic: normal external genitalia, normal vaginal mucosa.  Cervix without lesions.  The risks of endometrial biopsy were reviewed and a consent was obtained.  A speculum was placed in the vagina and the cervix was cleansed with betadine. A tenaculum was placed on the cervix and the uterine evacuator was placed into the endometrial cavity. The uterus sounded to 7 cm. The endometrial curettage was performed, a large amount tissue was obtained. The cavity had the characteristically gritty texture at the end of the procedure. The tenaculum and speculum were removed. There were no complications. Bleeding stopped with Curettage.   [facility].            

## 2018-06-23 ENCOUNTER — Observation Stay (HOSPITAL_COMMUNITY)
Admission: AD | Admit: 2018-06-23 | Discharge: 2018-06-24 | Disposition: A | Discharge status: Home / Self Care | Payer: BLUE CROSS/BLUE SHIELD | Source: Ambulatory Visit | Attending: Obstetrics and Gynecology | Admitting: Obstetrics and Gynecology

## 2018-06-23 ENCOUNTER — Encounter: Payer: Self-pay | Admitting: Obstetrics and Gynecology

## 2018-06-23 ENCOUNTER — Other Ambulatory Visit: Payer: Self-pay

## 2018-06-23 ENCOUNTER — Ambulatory Visit (INDEPENDENT_AMBULATORY_CARE_PROVIDER_SITE_OTHER): Payer: BLUE CROSS/BLUE SHIELD

## 2018-06-23 ENCOUNTER — Ambulatory Visit (INDEPENDENT_AMBULATORY_CARE_PROVIDER_SITE_OTHER): Payer: BLUE CROSS/BLUE SHIELD | Admitting: Obstetrics and Gynecology

## 2018-06-23 VITALS — BP 124/68 | HR 64 | Wt 229.0 lb

## 2018-06-23 DIAGNOSIS — N760 Acute vaginitis: Secondary | ICD-10-CM

## 2018-06-23 DIAGNOSIS — N938 Other specified abnormal uterine and vaginal bleeding: Secondary | ICD-10-CM | POA: Diagnosis not present

## 2018-06-23 DIAGNOSIS — D649 Anemia, unspecified: Secondary | ICD-10-CM

## 2018-06-23 DIAGNOSIS — Z79899 Other long term (current) drug therapy: Secondary | ICD-10-CM | POA: Diagnosis not present

## 2018-06-23 DIAGNOSIS — N921 Excessive and frequent menstruation with irregular cycle: Secondary | ICD-10-CM | POA: Diagnosis not present

## 2018-06-23 DIAGNOSIS — Z862 Personal history of diseases of the blood and blood-forming organs and certain disorders involving the immune mechanism: Secondary | ICD-10-CM | POA: Diagnosis not present

## 2018-06-23 DIAGNOSIS — B9689 Other specified bacterial agents as the cause of diseases classified elsewhere: Secondary | ICD-10-CM

## 2018-06-23 DIAGNOSIS — D6489 Other specified anemias: Principal | ICD-10-CM | POA: Insufficient documentation

## 2018-06-23 DIAGNOSIS — IMO0001 Reserved for inherently not codable concepts without codable children: Secondary | ICD-10-CM

## 2018-06-23 DIAGNOSIS — Z789 Other specified health status: Secondary | ICD-10-CM | POA: Diagnosis not present

## 2018-06-23 LAB — CBC
HEMATOCRIT: 25.1 % — AB (ref 36.0–46.0)
HEMOGLOBIN: 7.5 g/dL — AB (ref 12.0–15.0)
MCH: 19.9 pg — AB (ref 26.0–34.0)
MCHC: 29.9 g/dL — ABNORMAL LOW (ref 30.0–36.0)
MCV: 66.8 fL — ABNORMAL LOW (ref 78.0–100.0)
Platelets: 401 10*3/uL — ABNORMAL HIGH (ref 150–400)
RBC: 3.76 MIL/uL — ABNORMAL LOW (ref 3.87–5.11)
RDW: 20.9 % — ABNORMAL HIGH (ref 11.5–15.5)
WBC: 7.8 10*3/uL (ref 4.0–10.5)

## 2018-06-23 LAB — PREPARE RBC (CROSSMATCH)

## 2018-06-23 LAB — ABO/RH: ABO/RH(D): A POS

## 2018-06-23 MED ORDER — ACETAMINOPHEN 325 MG PO TABS
650.0000 mg | ORAL_TABLET | Freq: Once | ORAL | Status: AC
  Administered 2018-06-23: 650 mg via ORAL
  Filled 2018-06-23: qty 2

## 2018-06-23 MED ORDER — SODIUM CHLORIDE 0.9% IV SOLUTION
Freq: Once | INTRAVENOUS | Status: AC
  Administered 2018-06-23: 19:00:00 via INTRAVENOUS

## 2018-06-23 MED ORDER — DIPHENHYDRAMINE HCL 25 MG PO CAPS
25.0000 mg | ORAL_CAPSULE | Freq: Once | ORAL | Status: AC
  Administered 2018-06-23: 25 mg via ORAL
  Filled 2018-06-23: qty 1

## 2018-06-23 MED ORDER — NORETHIN ACE-ETH ESTRAD-FE 1-20 MG-MCG PO TABS
1.0000 | ORAL_TABLET | Freq: Every day | ORAL | 0 refills | Status: DC
Start: 2018-06-23 — End: 2019-06-16

## 2018-06-23 MED ORDER — METRONIDAZOLE 500 MG PO TABS: 500.0000 mg | ORAL_TABLET | Freq: Two times a day (BID) | ORAL | 0 refills | Status: DC

## 2018-06-23 NOTE — Plan of Care (Signed)
  Problem: Education: Goal: Knowledge of General Education information will improve Description Including pain rating scale, medication(s)/side effects and non-pharmacologic comfort measures Outcome: Completed/Met

## 2018-06-23 NOTE — H&P (Signed)
GYNECOLOGY  VISIT   HPI: 22 y.o.   Single  African American  female   G0P0000 with Patient's last menstrual period was 06/11/2018 (exact date).   here for consult after PUS. The patient has been bleeding for most of the last 9 months. She was treated with provera, finished in the last week or so. Never stopped bleeding with the provera, but spotted. After finishing the provera her bleeding got very heavy, currently around the 10th day of her cycle. She is saturating a pad in up to an hour, bad cramps. Occasionally feel light head, hasn't gotten to the point of feeling like she was going to pass out. Not short of breath.  Hgb was 9.2 on 7/18. Hasn't started the iron yet.  She wants to start on OCP's Recent pap with BV, she does note a d/c with an odor.   GYNECOLOGIC HISTORY: Patient's last menstrual period was 06/11/2018 (exact date). Contraception: Condoms Menopausal hormone therapy: None                OB History    Gravida  0   Para  0   Term  0   Preterm  0   AB  0   Living  0     SAB  0   TAB  0   Ectopic  0   Multiple  0   Live Births  0                  Patient Active Problem List   Diagnosis Date Noted  . Polycystic ovaries 04/29/2016  . Obesity 04/12/2016  . Amenorrhea 04/11/2016  . Postcoital bleeding 04/11/2016  . Vaginitis 04/11/2016        Past Medical History:  Diagnosis Date  . Abnormal uterine bleeding   . Asthma   . Hormone disorder     History reviewed. No pertinent surgical history.        Current Outpatient Medications  Medication Sig Dispense Refill  . nystatin ointment (MYCOSTATIN) Apply 1 application topically 2 (two) times daily. Apply to affected area for up to 7 days. 30 g 0  . valACYclovir (VALTREX) 500 MG tablet Take 1 tablet (500 mg total) by mouth daily. 30 tablet 11   No current facility-administered medications for this visit.      ALLERGIES: Patient has no known allergies.       Family  History  Problem Relation Age of Onset  . Diabetes Father     Social History        Socioeconomic History  . Marital status: Single    Spouse name: Not on file  . Number of children: Not on file  . Years of education: Not on file  . Highest education level: Not on file  Occupational History  . Not on file  Social Needs  . Financial resource strain: Not on file  . Food insecurity:    Worry: Not on file    Inability: Not on file  . Transportation needs:    Medical: Not on file    Non-medical: Not on file  Tobacco Use  . Smoking status: Never Smoker  . Smokeless tobacco: Never Used  Substance and Sexual Activity  . Alcohol use: Not Currently  . Drug use: Not Currently  . Sexual activity: Yes    Birth control/protection: Condom  Lifestyle  . Physical activity:    Days per week: Not on file    Minutes per session: Not on file  .  Stress: Not on file  Relationships  . Social connections:    Talks on phone: Not on file    Gets together: Not on file    Attends religious service: Not on file    Active member of club or organization: Not on file    Attends meetings of clubs or organizations: Not on file    Relationship status: Not on file  . Intimate partner violence:    Fear of current or ex partner: Not on file    Emotionally abused: Not on file    Physically abused: Not on file    Forced sexual activity: Not on file  Other Topics Concern  . Not on file  Social History Narrative  . Not on file    Review of Systems  Constitutional: Negative.   HENT: Negative.   Eyes: Negative.   Respiratory: Negative.   Cardiovascular: Negative.   Gastrointestinal:       Bloating  Genitourinary:       Excess bleeding Painful menses AUB Pain and bleeding with intercourse  Musculoskeletal: Negative.   Skin:       Hair growth  Neurological: Positive for headaches.  Endo/Heme/Allergies: Negative.   Psychiatric/Behavioral: Negative.      PHYSICAL EXAMINATION:    BP 124/68 (BP Location: Right Arm, Patient Position: Sitting)   Pulse 64   Wt 229 lb (103.9 kg)   LMP 06/11/2018 (Exact Date)   BMI 37.25 kg/m     General appearance: alert, cooperative and appears stated age  ASSESSMENT Menometrorrhagia, c/w anovulatory bleeding. Heavy bleed s/p provera, still bleeding H/O anemia BV on pap, + symptoms    PLAN Start OCPs, risks reviewed, no contraindications Not sexually active since prior to the provera and her negative hcG.  Start flagyl, no ETOH Start ferrex 150 mg BID CBC, ferritin   An After Visit Summary was printed and given to the patient.  ~25 minutes face to face time of which over 50% was spent in counseling.   Addendum: HGB 6.5 Discussed patient's hgb and continued active bleeding with the patient. Recommended she have an office D&C and then go to [facility] for a blood transfusion.  Pelvic: normal external genitalia, normal vaginal mucosa.  Cervix without lesions.  The risks of endometrial biopsy were reviewed and a consent was obtained.  A speculum was placed in the vagina and the cervix was cleansed with betadine. A tenaculum was placed on the cervix and the uterine evacuator was placed into the endometrial cavity. The uterus sounded to 7 cm. The endometrial curettage was performed, a large amount tissue was obtained. The cavity had the characteristically gritty texture at the end of the procedure. The tenaculum and speculum were removed. There were no complications. Bleeding stopped with Curettage.   Patient sent to [facility] for blood transfusion.

## 2018-06-23 NOTE — Patient Instructions (Signed)
Start iron (ferrex 150 mg 2 x a day)   Oral Contraception Use Oral contraceptive pills (OCPs) are medicines taken to prevent pregnancy. OCPs work by preventing the ovaries from releasing eggs. The hormones in OCPs also cause the cervical mucus to thicken, preventing the sperm from entering the uterus. The hormones also cause the uterine lining to become thin, not allowing a fertilized egg to attach to the inside of the uterus. OCPs are highly effective when taken exactly as prescribed. However, OCPs do not prevent sexually transmitted diseases (STDs). Safe sex practices, such as using condoms along with an OCP, can help prevent STDs. Before taking OCPs, you may have a physical exam and Pap test. Your health care provider may also order blood tests if necessary. Your health care provider will make sure you are a good candidate for oral contraception. Discuss with your health care provider the possible side effects of the OCP you may be prescribed. When starting an OCP, it can take 2 to 3 months for the body to adjust to the changes in hormone levels in your body. How to take oral contraceptive pills Your health care provider may advise you on how to start taking the first cycle of OCPs. Otherwise, you can:  Start on day 1 of your menstrual period. You will not need any backup contraceptive protection with this start time.  Start on the first Sunday after your menstrual period or the day you get your prescription. In these cases, you will need to use backup contraceptive protection for the first week.  Start the pill at any time of your cycle. If you take the pill within 5 days of the start of your period, you are protected against pregnancy right away. In this case, you will not need a backup form of birth control. If you start at any other time of your menstrual cycle, you will need to use another form of birth control for 7 days. If your OCP is the type called a minipill, it will protect you from  pregnancy after taking it for 2 days (48 hours).  After you have started taking OCPs:  If you forget to take 1 pill, take it as soon as you remember. Take the next pill at the regular time.  If you miss 2 or more pills, call your health care provider because different pills have different instructions for missed doses. Use backup birth control until your next menstrual period starts.  If you use a 28-day pack that contains inactive pills and you miss 1 of the last 7 pills (pills with no hormones), it will not matter. Throw away the rest of the non-hormone pills and start a new pill pack.  No matter which day you start the OCP, you will always start a new pack on that same day of the week. Have an extra pack of OCPs and a backup contraceptive method available in case you miss some pills or lose your OCP pack. Follow these instructions at home:  Do not smoke.  Always use a condom to protect against STDs. OCPs do not protect against STDs.  Use a calendar to mark your menstrual period days.  Read the information and directions that came with your OCP. Talk to your health care provider if you have questions. Contact a health care provider if:  You develop nausea and vomiting.  You have abnormal vaginal discharge or bleeding.  You develop a rash.  You miss your menstrual period.  You are losing your hair.  You need treatment for mood swings or depression.  You get dizzy when taking the OCP.  You develop acne from taking the OCP.  You become pregnant. Get help right away if:  You develop chest pain.  You develop shortness of breath.  You have an uncontrolled or severe headache.  You develop numbness or slurred speech.  You develop visual problems.  You develop pain, redness, and swelling in the legs. This information is not intended to replace advice given to you by your health care provider. Make sure you discuss any questions you have with your health care  provider. Document Released: 10/24/2011 Document Revised: 04/11/2016 Document Reviewed: 04/25/2013 Elsevier Interactive Patient Education  2017 Elsevier Inc.  Bacterial Vaginosis Bacterial vaginosis is a vaginal infection that occurs when the normal balance of bacteria in the vagina is disrupted. It results from an overgrowth of certain bacteria. This is the most common vaginal infection among women ages 30-44. Because bacterial vaginosis increases your risk for STIs (sexually transmitted infections), getting treated can help reduce your risk for chlamydia, gonorrhea, herpes, and HIV (human immunodeficiency virus). Treatment is also important for preventing complications in pregnant women, because this condition can cause an early (premature) delivery. What are the causes? This condition is caused by an increase in harmful bacteria that are normally present in small amounts in the vagina. However, the reason that the condition develops is not fully understood. What increases the risk? The following factors may make you more likely to develop this condition:  Having a new sexual partner or multiple sexual partners.  Having unprotected sex.  Douching.  Having an intrauterine device (IUD).  Smoking.  Drug and alcohol abuse.  Taking certain antibiotic medicines.  Being pregnant.  You cannot get bacterial vaginosis from toilet seats, bedding, swimming pools, or contact with objects around you. What are the signs or symptoms? Symptoms of this condition include:  Grey or white vaginal discharge. The discharge can also be watery or foamy.  A fish-like odor with discharge, especially after sexual intercourse or during menstruation.  Itching in and around the vagina.  Burning or pain with urination.  Some women with bacterial vaginosis have no signs or symptoms. How is this diagnosed? This condition is diagnosed based on:  Your medical history.  A physical exam of the  vagina.  Testing a sample of vaginal fluid under a microscope to look for a large amount of bad bacteria or abnormal cells. Your health care provider may use a cotton swab or a small wooden spatula to collect the sample.  How is this treated? This condition is treated with antibiotics. These may be given as a pill, a vaginal cream, or a medicine that is put into the vagina (suppository). If the condition comes back after treatment, a second round of antibiotics may be needed. Follow these instructions at home: Medicines  Take over-the-counter and prescription medicines only as told by your health care provider.  Take or use your antibiotic as told by your health care provider. Do not stop taking or using the antibiotic even if you start to feel better. General instructions  If you have a female sexual partner, tell her that you have a vaginal infection. She should see her health care provider and be treated if she has symptoms. If you have a female sexual partner, he does not need treatment.  During treatment: ? Avoid sexual activity until you finish treatment. ? Do not douche. ? Avoid alcohol as directed by your health care  provider. ? Avoid breastfeeding as directed by your health care provider.  Drink enough water and fluids to keep your urine clear or pale yellow.  Keep the area around your vagina and rectum clean. ? Wash the area daily with warm water. ? Wipe yourself from front to back after using the toilet.  Keep all follow-up visits as told by your health care provider. This is important. How is this prevented?  Do not douche.  Wash the outside of your vagina with warm water only.  Use protection when having sex. This includes latex condoms and dental dams.  Limit how many sexual partners you have. To help prevent bacterial vaginosis, it is best to have sex with just one partner (monogamous).  Make sure you and your sexual partner are tested for STIs.  Wear cotton or  cotton-lined underwear.  Avoid wearing tight pants and pantyhose, especially during summer.  Limit the amount of alcohol that you drink.  Do not use any products that contain nicotine or tobacco, such as cigarettes and e-cigarettes. If you need help quitting, ask your health care provider.  Do not use illegal drugs. Where to find more information:  Centers for Disease Control and Prevention: SolutionApps.co.zawww.cdc.gov/std  American Sexual Health Association (ASHA): www.ashastd.org  [place] Department of Health and Health and safety inspectorHuman Services, Office on Women's Health: ConventionalMedicines.siwww.womenshealth.gov/ or http://www.anderson-williamson.info/https://www.womenshealth.gov/a-z-topics/bacterial-vaginosis Contact a health care provider if:  Your symptoms do not improve, even after treatment.  You have more discharge or pain when urinating.  You have a fever.  You have pain in your abdomen.  You have pain during sex.  You have vaginal bleeding between periods. Summary  Bacterial vaginosis is a vaginal infection that occurs when the normal balance of bacteria in the vagina is disrupted.  Because bacterial vaginosis increases your risk for STIs (sexually transmitted infections), getting treated can help reduce your risk for chlamydia, gonorrhea, herpes, and HIV (human immunodeficiency virus). Treatment is also important for preventing complications in pregnant women, because the condition can cause an early (premature) delivery.  This condition is treated with antibiotic medicines. These may be given as a pill, a vaginal cream, or a medicine that is put into the vagina (suppository). This information is not intended to replace advice given to you by your health care provider. Make sure you discuss any questions you have with your health care provider. Document Released: 11/04/2005 Document Revised: 03/10/2017 Document Reviewed: 07/20/2016 Elsevier Interactive Patient Education  [employer]2018 Elsevier Inc.

## 2018-06-24 ENCOUNTER — Telehealth: Payer: Self-pay

## 2018-06-24 DIAGNOSIS — D6489 Other specified anemias: Secondary | ICD-10-CM | POA: Diagnosis not present

## 2018-06-24 DIAGNOSIS — D649 Anemia, unspecified: Secondary | ICD-10-CM | POA: Diagnosis present

## 2018-06-24 DIAGNOSIS — N858 Other specified noninflammatory disorders of uterus: Secondary | ICD-10-CM | POA: Diagnosis not present

## 2018-06-24 DIAGNOSIS — N938 Other specified abnormal uterine and vaginal bleeding: Secondary | ICD-10-CM | POA: Diagnosis not present

## 2018-06-24 DIAGNOSIS — Z79899 Other long term (current) drug therapy: Secondary | ICD-10-CM | POA: Diagnosis not present

## 2018-06-24 LAB — CBC
HCT: 29.9 % — ABNORMAL LOW (ref 36.0–46.0)
HEMATOCRIT: 24.8 % — AB (ref 34.0–46.6)
HEMOGLOBIN: 9.2 g/dL — AB (ref 12.0–15.0)
Hemoglobin: 7 g/dL — CL (ref 11.1–15.9)
MCH: 19.1 pg — AB (ref 26.6–33.0)
MCH: 22 pg — ABNORMAL LOW (ref 26.0–34.0)
MCHC: 28.2 g/dL — AB (ref 31.5–35.7)
MCHC: 30.8 g/dL (ref 30.0–36.0)
MCV: 68 fL — AB (ref 79–97)
MCV: 71.4 fL — AB (ref 78.0–100.0)
Platelets: 456 10*3/uL — ABNORMAL HIGH (ref 150–450)
Platelets: 354 10*3/uL (ref 150–400)
RBC: 3.66 x10E6/uL — ABNORMAL LOW (ref 3.77–5.28)
RBC: 4.19 MIL/uL (ref 3.87–5.11)
RDW: 21.4 % — AB (ref 12.3–15.4)
RDW: 23 % — ABNORMAL HIGH (ref 11.5–15.5)
WBC: 7.5 10*3/uL (ref 3.4–10.8)
WBC: 6.4 10*3/uL (ref 4.0–10.5)

## 2018-06-24 LAB — BPAM RBC
Blood Product Expiration Date: 201909032359
Blood Product Expiration Date: 201909062359
ISSUE DATE / TIME: 201908062001
ISSUE DATE / TIME: 201908062259
UNIT TYPE AND RH: 6200
Unit Type and Rh: 6200

## 2018-06-24 LAB — TYPE AND SCREEN
ABO/RH(D): A POS
Antibody Screen: NEGATIVE
Unit division: 0
Unit division: 0

## 2018-06-24 LAB — FERRITIN: Ferritin: 7 ng/mL — ABNORMAL LOW (ref 15–150)

## 2018-06-24 NOTE — Telephone Encounter (Signed)
-----   Message from Romualdo BolkJill Evelyn Jertson, MD sent at 06/24/2018  1:56 PM EDT ----- Office hgb was 6.5, the patient c/o feeling lightheaded. She is s/p office D&C and transfusion of 2 units of PRBC. She was sent home from the hospital today. Please call and see how she is feeling and set her up for a f/u visit next week.

## 2018-06-24 NOTE — Telephone Encounter (Signed)
Left message to call Kaitlyn at [phone]. 

## 2018-06-24 NOTE — Addendum Note (Signed)
Addended by: Tobi BastosJERTSON, Brenda Cowher E on: 06/24/2018 04:45 PM   Modules accepted: Orders

## 2018-06-29 NOTE — Telephone Encounter (Signed)
-----   Message from Romualdo BolkJill Evelyn Jertson, MD sent at 06/28/2018 11:24 AM EDT ----- Pathology is benign. Please call and inform and see how she is feeling (s/p office D&C and blood transfusion last week)

## 2018-06-29 NOTE — Discharge Summary (Signed)
Physician Discharge Summary   Patient ID: Tiffany Mason [id number] 22 y.o. 11/18/1995  Admit date: 06/23/2018  Discharge date and time: 06/24/2018 11:07 AM   Admitting Physician: Romualdo BolkJill Evelyn Jertson, MD   Discharge Physician: Romualdo BolkJill Evelyn Jertson, MD  Admission Diagnoses: Severe anemia  Discharge Diagnoses: Severe anemia  Admission Condition: stable  Discharged Condition: good  Indication for Admission: The patient was seen in the office with abnormal uterine bleeding. Office hgb was 6.5, she h/o feeling lightheaded. An office endometrial curettage was performed and she was admitted for blood transfusion   Hospital Course: Uncomplicated. She received 2 units of PRBC.   Consults: None  Significant Diagnostic Studies: labs: CBC pre and post transfusion CBC Latest Ref Rng & Units 06/24/2018 06/23/2018 06/23/2018  WBC 4.0 - 10.5 K/uL 6.4 7.8 7.5  Hemoglobin 12.0 - 15.0 g/dL 1.3(K9.2(L) 7.5(L) 7.0(LL)  Hematocrit 36.0 - 46.0 % 29.9(L) 25.1(L) 24.8(L)  Platelets 150 - 400 K/uL 354 401(H) 456(H)     Treatments: transfused with 2 units of PRBC  Disposition: Home  Patient Instructions:  Allergies as of 06/24/2018      Reactions   Fish Allergy Anaphylaxis      Medication List    TAKE these medications   metroNIDAZOLE 500 MG tablet Commonly known as:  FLAGYL Take 1 tablet (500 mg total) by mouth 2 (two) times daily.   norethindrone-ethinyl estradiol 1-20 MG-MCG tablet Commonly known as:  JUNEL FE,GILDESS FE,LOESTRIN FE Take 1 tablet by mouth daily.   nystatin ointment Commonly known as:  MYCOSTATIN Apply 1 application topically 2 (two) times daily. Apply to affected area for up to 7 days.   valACYclovir 500 MG tablet Commonly known as:  VALTREX Take 1 tablet (500 mg total) by mouth daily.      Activity: activity as tolerated Diet: regular diet Wound Care: none needed  Follow-up within 1 week  Signed: Romualdo BolkJill Evelyn Jertson 06/29/2018 10:37 AM

## 2018-06-29 NOTE — Telephone Encounter (Signed)
Spoke with patient. Advised of results and message as seen below from Dr.Jertson. Patient states that she is feeling so much better. Patient will check her work schedule today and return call to schedule a follow up appointment this week.

## 2018-06-30 NOTE — Telephone Encounter (Signed)
Left message to call Kaitlyn at [phone]. 

## 2018-07-07 NOTE — Telephone Encounter (Signed)
Left message to call Kaitlyn at [phone]. 

## 2018-07-19 DIAGNOSIS — L209 Atopic dermatitis, unspecified: Secondary | ICD-10-CM | POA: Diagnosis not present

## 2018-08-24 NOTE — Telephone Encounter (Signed)
Please send the patient a letter asking her to schedule a f/u visit. We need to make sure she is doing well on OCP's, and refill that script to prevent her from getting into trouble with heavy bleeding again.

## 2018-08-24 NOTE — Telephone Encounter (Signed)
Routing to Dr.Jertson. Patient did not return for follow up evaluation. Please advise.

## 2018-08-25 NOTE — Telephone Encounter (Signed)
Letter written and to Dr.Jertson for review. 

## 2018-08-25 NOTE — Telephone Encounter (Signed)
Letter mailed to patient's home address on file. Encounter closed. °

## 2018-10-17 DIAGNOSIS — R197 Diarrhea, unspecified: Secondary | ICD-10-CM | POA: Diagnosis not present

## 2018-10-17 DIAGNOSIS — R112 Nausea with vomiting, unspecified: Secondary | ICD-10-CM | POA: Diagnosis not present

## 2019-01-26 ENCOUNTER — Telehealth: Payer: Self-pay | Admitting: Obstetrics and Gynecology

## 2019-01-26 NOTE — Telephone Encounter (Signed)
Spoke with patient. Last seen in office on 06/23/18, office D&C preformed, sent to [facility] for blood transfusion. Patient reports cycles were normal and regular after. Started bleeding again 1 month ago, changing nonsaturated pad 2-3x/day, with occasional clots. Denies fatigue, SOB, weakness, dizziness, lightheadedness, headache. No contraceptive. Patient states she is not currently SA. Recommended OV for further evaluation, patient declined OV for 3/11 and 3/12, request 4pm OV next wk. Encouraged earlier appt with Dr. Oscar La, patient again declined. OV scheduled for 3/18 at 4pm with Dr. Oscar La. ER precautions reviewed for heavy bleeding and new or worsening symptoms. Advised to return call to office if she can be seen sooner. Dr. Oscar La will review, our office will return call if any additional recommendations.   Routing to provider for final review. Patient is agreeable to disposition. Will close encounter.

## 2019-01-26 NOTE — Telephone Encounter (Signed)
Patient returning call. States she can be reached on her mobile number at 3 pm.

## 2019-01-26 NOTE — Telephone Encounter (Signed)
Patient has been having bleeding for weeks and would to see Dr.Jertson asap.

## 2019-01-26 NOTE — Telephone Encounter (Signed)
Call returned to patient, no answer, voicemail not set up, unable to leave message.

## 2019-01-27 NOTE — Telephone Encounter (Signed)
Call to patient, no answer, voicemail not set up, unable to leave message.   Not MyChart active

## 2019-01-27 NOTE — Telephone Encounter (Signed)
Call returned to patient, no answer, voicemail not set up, unable to leave message.  

## 2019-01-27 NOTE — Telephone Encounter (Signed)
The patient was given a script for OCP's in 8/19, did she ever start them?  If she has been sexually active since her last visit she should check a UPT. I suspect she is having another anovulatory bleed. She got severely anemic previously.  Please start her on provera (as long as negative UPT) 10 mg, 1 po BID until bleeding stops, then she can decrease to one tablet a day. Please call in 30 tablets.

## 2019-01-28 MED ORDER — MEDROXYPROGESTERONE ACETATE 10 MG PO TABS: ORAL_TABLET | ORAL | 0 refills | Status: DC

## 2019-01-28 NOTE — Addendum Note (Signed)
Addended by: Leda Min on: 01/28/2019 11:16 AM   Modules accepted: Orders

## 2019-01-28 NOTE — Telephone Encounter (Signed)
Spoke with patient. Advised as seen below per Dr. Oscar La. Patient states she did take OCPs until approximately 1 month ago. States she has not been SA since last OV 06/23/18. Rx for Provera sent to confirmed pharmacy on file, patient read back instructions. Patient reports flow has lightened, "not changing pad very often at all, 2-3x/day, still having occasional clots". Denies any new symptoms. Advised to return call to office if any additional questions/concerns. ER precautions reviewed for heavy bleeding or new/worsening symptoms.   Routing to provider for final review. Patient is agreeable to disposition. Will close encounter.

## 2019-02-02 NOTE — Progress Notes (Deleted)
GYNECOLOGY  VISIT   HPI: 23 y.o.   Single Black or African American Not Hispanic or Latino  female   G0P0000 with No LMP recorded.   here for     GYNECOLOGIC HISTORY: No LMP recorded. Contraception:*** Menopausal hormone therapy: ***        OB History    Gravida  0   Para  0   Term  0   Preterm  0   AB  0   Living  0     SAB  0   TAB  0   Ectopic  0   Multiple  0   Live Births  0              Patient Active Problem List   Diagnosis Date Noted  . Anemia 06/24/2018  . Menometrorrhagia 06/23/2018  . Severe anemia 06/23/2018  . Polycystic ovaries 04/29/2016  . Obesity 04/12/2016  . Amenorrhea 04/11/2016  . Postcoital bleeding 04/11/2016  . Vaginitis 04/11/2016    Past Medical History:  Diagnosis Date  . Abnormal uterine bleeding   . Asthma   . Hormone disorder     No past surgical history on file.  Current Outpatient Medications  Medication Sig Dispense Refill  . medroxyPROGESTERone (PROVERA) 10 MG tablet Take 1 tablet PO (10 mg) bid until bleeding stops, then reduce to 1 tablet PO daily. 30 tablet 0  . metroNIDAZOLE (FLAGYL) 500 MG tablet Take 1 tablet (500 mg total) by mouth 2 (two) times daily. (Patient not taking: Reported on 06/24/2018) 14 tablet 0  . norethindrone-ethinyl estradiol (JUNEL FE,GILDESS FE,LOESTRIN FE) 1-20 MG-MCG tablet Take 1 tablet by mouth daily. (Patient not taking: Reported on 06/24/2018) 3 Package 0  . nystatin ointment (MYCOSTATIN) Apply 1 application topically 2 (two) times daily. Apply to affected area for up to 7 days. 30 g 0  . valACYclovir (VALTREX) 500 MG tablet Take 1 tablet (500 mg total) by mouth daily. 30 tablet 11   No current facility-administered medications for this visit.      ALLERGIES: Fish allergy  Family History  Problem Relation Age of Onset  . Diabetes Father     Social History   Socioeconomic History  . Marital status: Single    Spouse name: Not on file  . Number of children: Not on file  .  Years of education: Not on file  . Highest education level: Not on file  Occupational History  . Not on file  Social Needs  . Financial resource strain: Not on file  . Food insecurity:    Worry: Not on file    Inability: Not on file  . Transportation needs:    Medical: Not on file    Non-medical: Not on file  Tobacco Use  . Smoking status: Never Smoker  . Smokeless tobacco: Never Used  Substance and Sexual Activity  . Alcohol use: Not Currently  . Drug use: Not Currently  . Sexual activity: Yes    Birth control/protection: Condom  Lifestyle  . Physical activity:    Days per week: Not on file    Minutes per session: Not on file  . Stress: Not on file  Relationships  . Social connections:    Talks on phone: Not on file    Gets together: Not on file    Attends religious service: Not on file    Active member of club or organization: Not on file    Attends meetings of clubs or organizations: Not on file  Relationship status: Not on file  . Intimate partner violence:    Fear of current or ex partner: Not on file    Emotionally abused: Not on file    Physically abused: Not on file    Forced sexual activity: Not on file  Other Topics Concern  . Not on file  Social History Narrative  . Not on file    ROS  PHYSICAL EXAMINATION:    There were no vitals taken for this visit.    General appearance: alert, cooperative and appears stated age Neck: no adenopathy, supple, symmetrical, trachea midline and thyroid {CHL AMB PHY EX THYROID NORM DEFAULT:[phone]::"normal to inspection and palpation"} Breasts: {Exam; breast:13139::"normal appearance, no masses or tenderness"} Abdomen: soft, non-tender; non distended, no masses,  no organomegaly  Pelvic: External genitalia:  no lesions              Urethra:  normal appearing urethra with no masses, tenderness or lesions              Bartholins and Skenes: normal                 Vagina: normal appearing vagina with normal color  and discharge, no lesions              Cervix: {CHL AMB PHY EX CERVIX NORM DEFAULT:[phone]::"no lesions"}              Bimanual Exam:  Uterus:  {CHL AMB PHY EX UTERUS NORM [phone]::"normal size, contour, position, consistency, mobility, non-tender"}              Adnexa: {CHL AMB PHY EX ADNEXA NO MASS [phone]::"no mass, fullness, tenderness"}              Rectovaginal: {yes no:[id number]}.  Confirms.              Anus:  normal sphincter tone, no lesions  Chaperone was present for exam.  ASSESSMENT     PLAN    An After Visit Summary was printed and given to the patient.  *** minutes face to face time of which over 50% was spent in counseling.

## 2019-02-03 ENCOUNTER — Telehealth: Payer: Self-pay | Admitting: Obstetrics and Gynecology

## 2019-02-03 ENCOUNTER — Ambulatory Visit: Payer: Self-pay | Admitting: Obstetrics and Gynecology

## 2019-02-03 NOTE — Telephone Encounter (Signed)
Patient called and cancelled her appointment for today with Dr. Oscar La for irregular cycles. 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 She said she'll need to call back to reschedule at another time and she is not sure when.

## 2019-06-10 NOTE — Progress Notes (Signed)
23 y.o. G0P0000 Single Black or African American Not Hispanic or Latino female here for annual exam.  H/O menometrorrhagia (anovulatory) in 2018-2019, normal ultrasound, treated with provera, then OCP's. She was only on OCP's for a month, then stopped it. Had nausea with the pill and trouble remembering.  H/O PCOS and hirsutism Sexually active, same partner x 1 year, using condoms. No dyspareunia.   Cycles are recently coming monthly x 7-10 days. She is currently a little late for her cycle. In the spring she bleed for weeks at a time. In the past she has been severely anemic and needed a blood transfusion.  Period Cycle (Days): 28 Period Duration (Days): 7-10 days Period Pattern: (!) Irregular Menstrual Flow: Heavy Menstrual Control: Thin pad Menstrual Control Change Freq (Hours): changes pad every 2 hours Dysmenorrhea: (!) Moderate Dysmenorrhea Symptoms: Cramping  No LMP recorded (lmp unknown).          Sexually active: Yes.    The current method of family planning is condoms most of the time.    Exercising: Yes.    working 2 jobs Smoker:  no  Health Maintenance: Pap:  06/04/2018 WNL History of abnormal Pap:  no TDaP:  Up to date per patient Gardasil: Patient thinks she got all 3   reports that she has never smoked. She has never used smokeless tobacco. She reports previous alcohol use. She reports previous drug use. Graduated from college in 12/19. Working in [employer]. Not sure wants to do.   Past Medical History:  Diagnosis Date  . Abnormal uterine bleeding   . Asthma   . Hirsutism   . History of anemia   . Hormone disorder   . PCOS (polycystic ovarian syndrome)   . Prediabetes     History reviewed. No pertinent surgical history.  Current Outpatient Medications  Medication Sig Dispense Refill  . valACYclovir (VALTREX) 500 MG tablet Take 1 tablet (500 mg total) by mouth daily. 30 tablet 11   No current facility-administered medications for this visit.   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   Family History  Problem Relation Age of Onset  . Diabetes Father     Review of Systems  Constitutional: Negative.   HENT: Negative.   Eyes: Negative.   Respiratory: Negative.   Cardiovascular: Negative.   Gastrointestinal: Negative.   Endocrine: Negative.   Genitourinary: Positive for menstrual problem.  Musculoskeletal: Negative.   Skin: Negative.   Allergic/Immunologic: Negative.   Neurological: Negative.   Hematological: Negative.     Exam:   BP 122/78 (BP Location: Right Arm, Patient Position: Sitting, Cuff Size: Large)   Pulse 80   Temp 98.1 F (36.7 C) (Skin)   Ht 5\' 6"  (1.676 m)   Wt 248 lb 12.8 oz (112.9 kg)   LMP  (LMP Unknown)   BMI 40.16 kg/m   Weight change: @WEIGHTCHANGE @ Height:   Height: 5\' 6"  (167.6 cm)  Ht Readings from Last 3 Encounters:  06/16/19 5\' 6"  (1.676 m)  06/04/18 5' 5.75" (1.67 m)    General appearance: alert, cooperative and appears stated age Head: Normocephalic, without obvious abnormality, atraumatic Neck: no adenopathy, supple, symmetrical, trachea midline and thyroid normal to inspection and palpation Lungs: clear to auscultation bilaterally Cardiovascular: regular rate and rhythm Breasts: normal appearance, no masses or tenderness Abdomen: soft, non-tender; non distended,  no masses,  no organomegaly Extremities: extremities normal, atraumatic, no cyanosis or edema Skin: Skin color, texture,  turgor normal. Significant erythema, rash under bilateral breasts and between her breasts. Also with erythema under her panus Lymph nodes: Cervical, supraclavicular, and axillary nodes normal. No abnormal inguinal nodes palpated Neurologic: Grossly normal   Pelvic: External genitalia:  no lesions, focal irritation/whitening above her clitoris.              Urethra:  normal appearing urethra with no masses, tenderness or lesions              Bartholins and  Skenes: normal                 Vagina: normal appearing vagina with normal color and discharge, no lesions              Cervix: no lesions               Bimanual Exam:  Uterus:  normal size, contour, position, consistency, mobility, non-tender              Adnexa: no mass, fullness, tenderness               Rectovaginal: Confirms               Anus:  normal sphincter tone, no lesions  Chaperone was present for exam.  On questioning during her exam she complains of itching and irritation under her breasts, under her panus and on her vulva. All symptoms have been long term.   A:  Well Woman with normal exam  PCOS  Hirsutism, stable  FH of DM  Prediabetes  BMI 40  Irregular menses, episodes of anovulation  H/O anemia  Severe candida intertrigo  Chronic vulvitis  P:   No pap this year  Screening labs, HgbA1C, ferritin  STD testing  Information on weight loss clinic given and discussed  UPT  Discussed OPC's, nuvaring, mirena IUD, declines all of them  Will do cyclic provera, she must use condoms and make sure she isn't pregnant.  Nystatin cream  Valisone ointment  Vulvar skin care information given

## 2019-06-14 ENCOUNTER — Other Ambulatory Visit: Payer: Self-pay

## 2019-06-16 ENCOUNTER — Ambulatory Visit (INDEPENDENT_AMBULATORY_CARE_PROVIDER_SITE_OTHER): Payer: BC Managed Care – PPO | Admitting: Obstetrics and Gynecology

## 2019-06-16 ENCOUNTER — Other Ambulatory Visit: Payer: Self-pay

## 2019-06-16 ENCOUNTER — Encounter: Payer: Self-pay | Admitting: Obstetrics and Gynecology

## 2019-06-16 VITALS — BP 122/78 | HR 80 | Temp 98.1°F | Ht 66.0 in | Wt 248.8 lb

## 2019-06-16 DIAGNOSIS — Z01419 Encounter for gynecological examination (general) (routine) without abnormal findings: Secondary | ICD-10-CM | POA: Diagnosis not present

## 2019-06-16 DIAGNOSIS — Z6841 Body Mass Index (BMI) 40.0 and over, adult: Secondary | ICD-10-CM

## 2019-06-16 DIAGNOSIS — L680 Hirsutism: Secondary | ICD-10-CM

## 2019-06-16 DIAGNOSIS — Z862 Personal history of diseases of the blood and blood-forming organs and certain disorders involving the immune mechanism: Secondary | ICD-10-CM

## 2019-06-16 DIAGNOSIS — R7303 Prediabetes: Secondary | ICD-10-CM | POA: Diagnosis not present

## 2019-06-16 DIAGNOSIS — E282 Polycystic ovarian syndrome: Secondary | ICD-10-CM | POA: Diagnosis not present

## 2019-06-16 DIAGNOSIS — N921 Excessive and frequent menstruation with irregular cycle: Secondary | ICD-10-CM

## 2019-06-16 DIAGNOSIS — Z3009 Encounter for other general counseling and advice on contraception: Secondary | ICD-10-CM

## 2019-06-16 DIAGNOSIS — B372 Candidiasis of skin and nail: Secondary | ICD-10-CM

## 2019-06-16 DIAGNOSIS — N970 Female infertility associated with anovulation: Secondary | ICD-10-CM

## 2019-06-16 DIAGNOSIS — Z113 Encounter for screening for infections with a predominantly sexual mode of transmission: Secondary | ICD-10-CM

## 2019-06-16 DIAGNOSIS — E7800 Pure hypercholesterolemia, unspecified: Secondary | ICD-10-CM | POA: Diagnosis not present

## 2019-06-16 DIAGNOSIS — Z0000 Encounter for general adult medical examination without abnormal findings: Secondary | ICD-10-CM | POA: Diagnosis not present

## 2019-06-16 DIAGNOSIS — N763 Subacute and chronic vulvitis: Secondary | ICD-10-CM | POA: Diagnosis not present

## 2019-06-16 MED ORDER — NYSTATIN 100000 UNIT/GM EX CREA: 1.0000 "application " | TOPICAL_CREAM | Freq: Two times a day (BID) | CUTANEOUS | 1 refills | Status: DC

## 2019-06-16 MED ORDER — VALACYCLOVIR HCL 500 MG PO TABS: 500.0000 mg | ORAL_TABLET | Freq: Every day | ORAL | 3 refills | Status: DC

## 2019-06-16 MED ORDER — MEDROXYPROGESTERONE ACETATE 5 MG PO TABS: ORAL_TABLET | ORAL | 3 refills | Status: DC

## 2019-06-16 MED ORDER — BETAMETHASONE VALERATE 0.1 % EX OINT: TOPICAL_OINTMENT | CUTANEOUS | 0 refills | Status: DC

## 2019-06-16 NOTE — Patient Instructions (Signed)
EXERCISE AND DIET:  We recommended that you start or continue a regular exercise program for good health. Regular exercise means any activity that makes your heart beat faster and makes you sweat.  We recommend exercising at least 30 minutes per day at least 3 days a week, preferably 4 or 5.  We also recommend a diet low in fat and sugar.  Inactivity, poor dietary choices and obesity can cause diabetes, heart attack, stroke, and kidney damage, among others.    ALCOHOL AND SMOKING:  Women should limit their alcohol intake to no more than 7 drinks/beers/glasses of wine (combined, not each!) per week. Moderation of alcohol intake to this level decreases your risk of breast cancer and liver damage. And of course, no recreational drugs are part of a healthy lifestyle.  And absolutely no smoking or even second hand smoke. Most people know smoking can cause heart and lung diseases, but did you know it also contributes to weakening of your bones? Aging of your skin?  Yellowing of your teeth and nails?  CALCIUM AND VITAMIN D:  Adequate intake of calcium and Vitamin D are recommended.  The recommendations for exact amounts of these supplements seem to change often, but generally speaking 1,000 mg of calcium (between diet and supplement) and 800 units of Vitamin D per day seems prudent. Certain women may benefit from higher intake of Vitamin D.  If you are among these women, your doctor will have told you during your visit.    PAP SMEARS:  Pap smears, to check for cervical cancer or precancers,  have traditionally been done yearly, although recent scientific advances have shown that most women can have pap smears less often.  However, every woman still should have a physical exam from her gynecologist every year. It will include a breast check, inspection of the vulva and vagina to check for abnormal growths or skin changes, a visual exam of the cervix, and then an exam to evaluate the size and shape of the uterus and  ovaries.  And after 24 years of age, a rectal exam is indicated to check for rectal cancers. We will also provide age appropriate advice regarding health maintenance, like when you should have certain vaccines, screening for sexually transmitted diseases, bone density testing, colonoscopy, mammograms, etc.   MAMMOGRAMS:  All women over 75 years old should have a yearly mammogram. Many facilities now offer a "3D" mammogram, which may cost around $50 extra out of pocket. If possible,  we recommend you accept the option to have the 3D mammogram performed.  It both reduces the number of women who will be called back for extra views which then turn out to be normal, and it is better than the routine mammogram at detecting truly abnormal areas.    COLON CANCER SCREENING: Now recommend starting at age 57. At this time colonoscopy is not covered for routine screening until 50. There are take home tests that can be done between 45-49.   COLONOSCOPY:  Colonoscopy to screen for colon cancer is recommended for all women at age 91.  We know, you hate the idea of the prep.  We agree, BUT, having colon cancer and not knowing it is worse!!  Colon cancer so often starts as a polyp that can be seen and removed at colonscopy, which can quite literally save your life!  And if your first colonoscopy is normal and you have no family history of colon cancer, most women don't have to have it again for  10 years.  Once every ten years, you can do something that may end up saving your life, right?  We will be happy to help you get it scheduled when you are ready.  Be sure to check your insurance coverage so you understand how much it will cost.  It may be covered as a preventative service at no cost, but you should check your particular policy.   ° ° ° °Breast Self-Awareness °Breast self-awareness means being familiar with how your breasts look and feel. It involves checking your breasts regularly and reporting any changes to your  health care provider. °Practicing breast self-awareness is important. A change in your breasts can be a sign of a serious medical problem. Being familiar with how your breasts look and feel allows you to find any problems early, when treatment is more likely to be successful. All women should practice breast self-awareness, including women who have had breast implants. °How to do a breast self-exam °One way to learn what is normal for your breasts and whether your breasts are changing is to do a breast self-exam. To do a breast self-exam: °Look for Changes ° °1. Remove all the clothing above your waist. °2. Stand in front of a mirror in a room with good lighting. °3. Put your hands on your hips. °4. Push your hands firmly downward. °5. Compare your breasts in the mirror. Look for differences between them (asymmetry), such as: °? Differences in shape. °? Differences in size. °? Puckers, dips, and bumps in one breast and not the other. °6. Look at each breast for changes in your skin, such as: °? Redness. °? Scaly areas. °7. Look for changes in your nipples, such as: °? Discharge. °? Bleeding. °? Dimpling. °? Redness. °? A change in position. °Feel for Changes °Carefully feel your breasts for lumps and changes. It is best to do this while lying on your back on the floor and again while sitting or standing in the shower or tub with soapy water on your skin. Feel each breast in the following way: °· Place the arm on the side of the breast you are examining above your head. °· Feel your breast with the other hand. °· Start in the nipple area and make ¾ inch (2 cm) overlapping circles to feel your breast. Use the pads of your three middle fingers to do this. Apply light pressure, then medium pressure, then firm pressure. The light pressure will allow you to feel the tissue closest to the skin. The medium pressure will allow you to feel the tissue that is a little deeper. The firm pressure will allow you to feel the tissue  close to the ribs. °· Continue the overlapping circles, moving downward over the breast until you feel your ribs below your breast. °· Move one finger-width toward the center of the body. Continue to use the ¾ inch (2 cm) overlapping circles to feel your breast as you move slowly up toward your collarbone. °· Continue the up and down exam using all three pressures until you reach your armpit. ° °Write Down What You Find ° °Write down what is normal for each breast and any changes that you find. Keep a written record with breast changes or normal findings for each breast. By writing this information down, you do not need to depend only on memory for size, tenderness, or location. Write down where you are in your menstrual cycle, if you are still menstruating. °If you are having trouble noticing differences   in your breasts, do not get discouraged. With time you will become more familiar with the variations in your breasts and more comfortable with the exam. How often should I examine my breasts? Examine your breasts every month. If you are breastfeeding, the best time to examine your breasts is after a feeding or after using a breast pump. If you menstruate, the best time to examine your breasts is 5-7 days after your period is over. During your period, your breasts are lumpier, and it may be more difficult to notice changes. When should I see my health care provider? See your health care provider if you notice:  A change in shape or size of your breasts or nipples.  A change in the skin of your breast or nipples, such as a reddened or scaly area.  Unusual discharge from your nipples.  A lump or thick area that was not there before.  Pain in your breasts.  Anything that concerns you.  Abnormal Uterine Bleeding Abnormal uterine bleeding means bleeding more than usual from your uterus. It can include:  Bleeding between periods.  Bleeding after sex.  Bleeding that is heavier than  normal.  Periods that last longer than usual.  Bleeding after you have stopped having your period (menopause). There are many problems that may cause this. You should see a doctor for any kind of bleeding that is not normal. Treatment depends on the cause of the bleeding. Follow these instructions at home:  Watch your condition for any changes.  Do not use tampons, douche, or have sex, if your doctor tells you not to.  Change your pads often.  Get regular well-woman exams. Make sure they include a pelvic exam and cervical cancer screening.  Keep all follow-up visits as told by your doctor. This is important. Contact a doctor if:  The bleeding lasts more than one week.  You feel dizzy at times.  You feel like you are going to throw up (nauseous).  You throw up. Get help right away if:  You pass out.  You have to change pads every hour.  You have belly (abdominal) pain.  You have a fever.  You get sweaty.  You get weak.  You passing large blood clots from your vagina. Summary  Abnormal uterine bleeding means bleeding more than usual from your uterus.  There are many problems that may cause this. You should see a doctor for any kind of bleeding that is not normal.  Treatment depends on the cause of the bleeding. This information is not intended to replace advice given to you by your health care provider. Make sure you discuss any questions you have with your health care provider. Document Released: 09/01/2009 Document Revised: 10/29/2016 Document Reviewed: 10/29/2016 Elsevier Patient Education  2020 [employer]  Polycystic Ovarian Syndrome  Polycystic ovarian syndrome (PCOS) is a common hormonal disorder among women of reproductive age. In most women with PCOS, many small fluid-filled sacs (cysts) grow on the ovaries, and the cysts are not part of a normal menstrual cycle. PCOS can cause problems with your menstrual periods and make it difficult to get pregnant.  It can also cause an increased risk of miscarriage with pregnancy. If it is not treated, PCOS can lead to serious health problems, such as diabetes and heart disease. What are the causes? The cause of PCOS is not known, but it may be the result of a combination of certain factors, such as:  Irregular menstrual cycle.  High levels of certain  hormones (androgens).  Problems with the hormone that helps to control blood sugar (insulin resistance).  Certain genes. What increases the risk? This condition is more likely to develop in women who have a family history of PCOS. What are the signs or symptoms? Symptoms of PCOS may include:  Multiple ovarian cysts.  Infrequent periods or no periods.  Periods that are too frequent or too heavy.  Unpredictable periods.  Inability to get pregnant (infertility) because of not ovulating.  Increased growth of hair on the face, chest, stomach, back, thumbs, thighs, or toes.  Acne or oily skin. Acne may develop during adulthood, and it may not respond to treatment.  Pelvic pain.  Weight gain or obesity.  Patches of thickened and dark brown or black skin on the neck, arms, breasts, or thighs (acanthosis nigricans).  Excess hair growth on the face, chest, abdomen, or upper thighs (hirsutism). How is this diagnosed? This condition is diagnosed based on:  Your medical history.  A physical exam, including a pelvic exam. Your health care provider may look for areas of increased hair growth on your skin.  Tests, such as: ? Ultrasound. This may be used to examine the ovaries and the lining of the uterus (endometrium) for cysts. ? Blood tests. These may be used to check levels of sugar (glucose), female hormone (testosterone), and female hormones (estrogen and progesterone) in your blood. How is this treated? There is no cure for PCOS, but treatment can help to manage symptoms and prevent more health problems from developing. Treatment varies  depending on:  Your symptoms.  Whether you want to have a baby or whether you need birth control (contraception). Treatment may include nutrition and lifestyle changes along with:  Progesterone hormone to start a menstrual period.  Birth control pills to help you have regular menstrual periods.  Medicines to make you ovulate, if you want to get pregnant.  Medicine to reduce excessive hair growth.  Surgery, in severe cases. This may involve making small holes in one or both of your ovaries. This decreases the amount of testosterone that your body produces. Follow these instructions at home:  Take over-the-counter and prescription medicines only as told by your health care provider.  Follow a healthy meal plan. This can help you reduce the effects of PCOS. ? Eat a healthy diet that includes lean proteins, complex carbohydrates, fresh fruits and vegetables, low-fat dairy products, and healthy fats. Make sure to eat enough fiber.  If you are overweight, lose weight as told by your health care provider. ? Losing 10% of your body weight may improve symptoms. ? Your health care provider can determine how much weight loss is best for you and can help you lose weight safely.  Keep all follow-up visits as told by your health care provider. This is important. Contact a health care provider if:  Your symptoms do not get better with medicine.  You develop new symptoms. This information is not intended to replace advice given to you by your health care provider. Make sure you discuss any questions you have with your health care provider. Document Released: 02/28/2005 Document Revised: 10/17/2017 Document Reviewed: 04/21/2016 Elsevier Patient Education  2020 [employer].

## 2019-06-17 ENCOUNTER — Telehealth: Payer: Self-pay

## 2019-06-17 DIAGNOSIS — R7303 Prediabetes: Secondary | ICD-10-CM

## 2019-06-17 DIAGNOSIS — E7800 Pure hypercholesterolemia, unspecified: Secondary | ICD-10-CM

## 2019-06-17 LAB — VAGINITIS/VAGINOSIS, DNA PROBE
Candida Species: NEGATIVE
Gardnerella vaginalis: POSITIVE — AB
Trichomonas vaginosis: NEGATIVE

## 2019-06-17 MED ORDER — METRONIDAZOLE 500 MG PO TABS: 500.0000 mg | ORAL_TABLET | Freq: Two times a day (BID) | ORAL | 0 refills | Status: DC

## 2019-06-17 NOTE — Telephone Encounter (Signed)
Spoke with patient. Advised of message as seen below from [place]. Patient verbalizes understanding. Rx for Flagyl 500 mg BID x 7 days #14 0 RF sent to pharmacy on file. Avoid alcohol during treatment and 24 hours after completing medication. Don't mix with alcohol if mixed can cause severe nausea, vomiting and abdominal cramping. Patient verbalizes understanding. Patient will start taking iron TID for the next 6 weeks. Will let us know if she cannot tolerate oral iron. Would like a referral to [facility]. Referral placed. Aware she will be contacted directly to schedule. 6 week lab follow up scheduled for 08/02/2019 at 9:15 am. Patient is agreeable to date and time. Encounter closed.

## 2019-06-17 NOTE — Telephone Encounter (Signed)
-----   Message from Salvadore Dom, MD sent at 06/17/2019 12:46 PM EDT ----- Please inform the patient that her vaginitis probe was + for BV and treat with flagyl (either oral or vaginal, her choice), no ETOH while on Flagyl.  Oral: Flagyl 500 mg BID x 7 days, or Vaginal: Metrogel, 1 applicator per vagina q day x 5 days. Please let her know that she is very anemic with low iron stores. She should be taking iron po TID for the next 6 weeks and then return for a CBC and ferritin. If she can't tolerate oral iron then she should have a hematology consultation. She is still prediabetic and has an elevated LDL with an elevated ratio. I would recommend a referral to the diabetic clinic.  RPR is pending. The rest of her blood work was normal. Her cervical cultures are pending.

## 2019-06-18 LAB — COMPREHENSIVE METABOLIC PANEL
ALT: 27 IU/L (ref 0–32)
AST: 23 IU/L (ref 0–40)
Albumin/Globulin Ratio: 1.3 (ref 1.2–2.2)
Albumin: 3.8 g/dL — ABNORMAL LOW (ref 3.9–5.0)
Alkaline Phosphatase: 66 IU/L (ref 39–117)
BUN/Creatinine Ratio: 19 (ref 9–23)
BUN: 12 mg/dL (ref 6–20)
Bilirubin Total: <0.2 mg/dL (ref 0.0–1.2)
CO2: 25 mmol/L (ref 20–29)
Calcium: 8.9 mg/dL (ref 8.7–10.2)
Chloride: 107 mmol/L — ABNORMAL HIGH (ref 96–106)
Creatinine, Ser: 0.64 mg/dL (ref 0.57–1.00)
GFR calc Af Amer: 144 mL/min/{1.73_m2} (ref >59)
GFR calc non Af Amer: 125 mL/min/{1.73_m2} (ref >59)
Globulin, Total: 3 g/dL (ref 1.5–4.5)
Glucose: 105 mg/dL — ABNORMAL HIGH (ref 65–99)
Potassium: 4.5 mmol/L (ref 3.5–5.2)
Sodium: 144 mmol/L (ref 134–144)
Total Protein: 6.8 g/dL (ref 6.0–8.5)

## 2019-06-18 LAB — LIPID PANEL
Chol/HDL Ratio: 4.8 ratio — ABNORMAL HIGH (ref 0.0–4.4)
Cholesterol, Total: 195 mg/dL (ref 100–199)
HDL: 41 mg/dL (ref >39)
LDL Calculated: 125 mg/dL — ABNORMAL HIGH (ref 0–99)
Triglycerides: 145 mg/dL (ref 0–149)
VLDL Cholesterol Cal: 29 mg/dL (ref 5–40)

## 2019-06-18 LAB — CBC
Hematocrit: 32.9 % — ABNORMAL LOW (ref 34.0–46.6)
Hemoglobin: 9.7 g/dL — ABNORMAL LOW (ref 11.1–15.9)
MCH: 21.1 pg — ABNORMAL LOW (ref 26.6–33.0)
MCHC: 29.5 g/dL — ABNORMAL LOW (ref 31.5–35.7)
MCV: 72 fL — ABNORMAL LOW (ref 79–97)
Platelets: 409 10*3/uL (ref 150–450)
RBC: 4.6 x10E6/uL (ref 3.77–5.28)
RDW: 18.6 % — ABNORMAL HIGH (ref 11.7–15.4)
WBC: 6.3 10*3/uL (ref 3.4–10.8)

## 2019-06-18 LAB — HEP, RPR, HIV PANEL
HIV Screen 4th Generation wRfx: NONREACTIVE
Hepatitis B Surface Ag: NEGATIVE
RPR Ser Ql: NONREACTIVE

## 2019-06-18 LAB — HEPATITIS C ANTIBODY: Hep C Virus Ab: <0.1 s/co ratio (ref 0.0–0.9)

## 2019-06-18 LAB — TSH: TSH: 2.11 u[IU]/mL (ref 0.450–4.500)

## 2019-06-18 LAB — FERRITIN: Ferritin: 13 ng/mL — ABNORMAL LOW (ref 15–150)

## 2019-06-18 LAB — HEMOGLOBIN A1C
Est. average glucose Bld gHb Est-mCnc: 131 mg/dL
Hgb A1c MFr Bld: 6.2 % — ABNORMAL HIGH (ref 4.8–5.6)

## 2019-06-22 LAB — GC/CHLAMYDIA PROBE AMP
Chlamydia trachomatis, NAA: NEGATIVE
Neisseria Gonorrhoeae by PCR: NEGATIVE

## 2019-08-02 ENCOUNTER — Other Ambulatory Visit: Payer: BC Managed Care – PPO

## 2019-08-02 ENCOUNTER — Other Ambulatory Visit: Payer: Self-pay | Admitting: *Deleted

## 2019-08-02 DIAGNOSIS — R899 Unspecified abnormal finding in specimens from other organs, systems and tissues: Secondary | ICD-10-CM

## 2019-08-04 ENCOUNTER — Other Ambulatory Visit: Payer: Self-pay

## 2019-08-06 ENCOUNTER — Other Ambulatory Visit (INDEPENDENT_AMBULATORY_CARE_PROVIDER_SITE_OTHER): Payer: BC Managed Care – PPO

## 2019-08-06 ENCOUNTER — Other Ambulatory Visit: Payer: Self-pay

## 2019-08-06 DIAGNOSIS — R899 Unspecified abnormal finding in specimens from other organs, systems and tissues: Secondary | ICD-10-CM

## 2019-08-07 LAB — CBC
Hematocrit: 32.1 % — ABNORMAL LOW (ref 34.0–46.6)
Hemoglobin: 9 g/dL — ABNORMAL LOW (ref 11.1–15.9)
MCH: 20 pg — ABNORMAL LOW (ref 26.6–33.0)
MCHC: 28 g/dL — ABNORMAL LOW (ref 31.5–35.7)
MCV: 72 fL — ABNORMAL LOW (ref 79–97)
Platelets: 388 10*3/uL (ref 150–450)
RBC: 4.49 x10E6/uL (ref 3.77–5.28)
RDW: 19.3 % — ABNORMAL HIGH (ref 11.7–15.4)
WBC: 4.7 10*3/uL (ref 3.4–10.8)

## 2019-08-07 LAB — FERRITIN: Ferritin: 10 ng/mL — ABNORMAL LOW (ref 15–150)

## 2019-08-09 ENCOUNTER — Telehealth: Payer: Self-pay

## 2019-08-09 DIAGNOSIS — D509 Iron deficiency anemia, unspecified: Secondary | ICD-10-CM

## 2019-08-09 NOTE — Telephone Encounter (Signed)
-----   Message from Salvadore Dom, MD sent at 08/09/2019  4:12 PM EDT ----- Please let the patient know that she has significant iron def anemia, worse than last month. Please check on her cycles and if she has taken the provera. Please set her up to see hematology.

## 2019-08-09 NOTE — Telephone Encounter (Signed)
Spoke with patient. Patient verbalizes understanding of results. Referral placed to [facility] for patient to see Dr.Ennever. Patient states that her cycles are still heavy changing a pad every 2 hours when on her menses. States she has not taken the Provera because it seems to make the bleeding worse. Patient is aware she will be contacted by [facility] to schedule an appointment for further evaluation.   Routing to provider and will close encounter.

## 2019-08-10 NOTE — Telephone Encounter (Signed)
Spoke with patient. Patient verbalizes understanding and will start taking provera consistently each month. Will call with any questions, concerns, or increased bleeding. Patient has an appointment with [facility] on 08/24/2019. Patient is aware and agreeable to the appointment.  Routing to provider and will close encounter.

## 2019-08-10 NOTE — Telephone Encounter (Signed)
Please let the patient know that the provera only empties what is in her uterus. If she takes it regularly it should help her from having such prolonged bleeding. It isn't normal to bleed to the point of anemia.

## 2019-08-23 ENCOUNTER — Other Ambulatory Visit: Payer: Self-pay | Admitting: Family

## 2019-08-23 DIAGNOSIS — D649 Anemia, unspecified: Secondary | ICD-10-CM

## 2019-08-24 ENCOUNTER — Inpatient Hospital Stay: Payer: BC Managed Care – PPO | Attending: Family | Admitting: Family

## 2019-08-24 ENCOUNTER — Inpatient Hospital Stay: Payer: BC Managed Care – PPO

## 2019-08-30 ENCOUNTER — Emergency Department (HOSPITAL_COMMUNITY): Payer: BC Managed Care – PPO

## 2019-08-30 ENCOUNTER — Other Ambulatory Visit: Payer: Self-pay

## 2019-08-30 ENCOUNTER — Telehealth: Payer: Self-pay | Admitting: Obstetrics and Gynecology

## 2019-08-30 ENCOUNTER — Emergency Department (HOSPITAL_COMMUNITY)
Admission: EM | Admit: 2019-08-30 | Discharge: 2019-08-30 | Disposition: A | Discharge status: Home / Self Care | Payer: BC Managed Care – PPO | Attending: Emergency Medicine | Admitting: Emergency Medicine

## 2019-08-30 DIAGNOSIS — N939 Abnormal uterine and vaginal bleeding, unspecified: Secondary | ICD-10-CM

## 2019-08-30 DIAGNOSIS — D649 Anemia, unspecified: Secondary | ICD-10-CM | POA: Insufficient documentation

## 2019-08-30 DIAGNOSIS — N920 Excessive and frequent menstruation with regular cycle: Secondary | ICD-10-CM | POA: Diagnosis not present

## 2019-08-30 DIAGNOSIS — R109 Unspecified abdominal pain: Secondary | ICD-10-CM | POA: Diagnosis not present

## 2019-08-30 DIAGNOSIS — R1030 Lower abdominal pain, unspecified: Secondary | ICD-10-CM | POA: Diagnosis not present

## 2019-08-30 DIAGNOSIS — Z79899 Other long term (current) drug therapy: Secondary | ICD-10-CM | POA: Diagnosis not present

## 2019-08-30 DIAGNOSIS — R42 Dizziness and giddiness: Secondary | ICD-10-CM | POA: Insufficient documentation

## 2019-08-30 LAB — URINALYSIS, ROUTINE W REFLEX MICROSCOPIC

## 2019-08-30 LAB — CBC
HCT: 32.7 % — ABNORMAL LOW (ref 36.0–46.0)
Hemoglobin: 9.3 g/dL — ABNORMAL LOW (ref 12.0–15.0)
MCH: 20.1 pg — ABNORMAL LOW (ref 26.0–34.0)
MCHC: 28.4 g/dL — ABNORMAL LOW (ref 30.0–36.0)
MCV: 70.6 fL — ABNORMAL LOW (ref 80.0–100.0)
Platelets: 411 10*3/uL — ABNORMAL HIGH (ref 150–400)
RBC: 4.63 MIL/uL (ref 3.87–5.11)
RDW: 19.6 % — ABNORMAL HIGH (ref 11.5–15.5)
WBC: 5.7 10*3/uL (ref 4.0–10.5)
nRBC: 0.4 % — ABNORMAL HIGH (ref 0.0–0.2)

## 2019-08-30 LAB — COMPREHENSIVE METABOLIC PANEL
ALT: 23 U/L (ref 0–44)
AST: 21 U/L (ref 15–41)
Albumin: 3.5 g/dL (ref 3.5–5.0)
Alkaline Phosphatase: 60 U/L (ref 38–126)
Anion gap: 10 (ref 5–15)
BUN: 12 mg/dL (ref 6–20)
CO2: 22 mmol/L (ref 22–32)
Calcium: 8.7 mg/dL — ABNORMAL LOW (ref 8.9–10.3)
Chloride: 107 mmol/L (ref 98–111)
Creatinine, Ser: 0.6 mg/dL (ref 0.44–1.00)
GFR calc Af Amer: >60 mL/min (ref >60)
GFR calc non Af Amer: >60 mL/min (ref >60)
Glucose, Bld: 110 mg/dL — ABNORMAL HIGH (ref 70–99)
Potassium: 3.8 mmol/L (ref 3.5–5.1)
Sodium: 139 mmol/L (ref 135–145)
Total Bilirubin: 0.2 mg/dL — ABNORMAL LOW (ref 0.3–1.2)
Total Protein: 7.3 g/dL (ref 6.5–8.1)

## 2019-08-30 LAB — LIPASE, BLOOD: Lipase: 27 U/L (ref 11–51)

## 2019-08-30 LAB — URINALYSIS, MICROSCOPIC (REFLEX)
RBC / HPF: >50 RBC/hpf (ref 0–5)
Squamous Epithelial / HPF: NONE SEEN (ref 0–5)

## 2019-08-30 LAB — I-STAT BETA HCG BLOOD, ED (MC, WL, AP ONLY): I-stat hCG, quantitative: <5 m[IU]/mL (ref <5)

## 2019-08-30 MED ORDER — SODIUM CHLORIDE 0.9 % IV BOLUS
1000.0000 mL | Freq: Once | INTRAVENOUS | Status: AC
  Administered 2019-08-30: 1000 mL via INTRAVENOUS

## 2019-08-30 MED ORDER — ACETAMINOPHEN 500 MG PO TABS
1000.0000 mg | ORAL_TABLET | Freq: Once | ORAL | Status: AC
  Administered 2019-08-30: 1000 mg via ORAL
  Filled 2019-08-30: qty 2

## 2019-08-30 NOTE — ED Notes (Signed)
Patient transported to Ultrasound 

## 2019-08-30 NOTE — Discharge Instructions (Addendum)
Stay well-hydrated follow-up with gynecology. Make sure you are on an iron supplement from pharmacy.

## 2019-08-30 NOTE — ED Triage Notes (Signed)
Pt reports abdominal cramping and pain from her period which started yesterday. Reports hx of blood transfusions due to anemia from heavy bleeding. VSS. NAd at present.

## 2019-08-30 NOTE — ED Notes (Signed)
Pt given dc instructions pt verbalizes understanding.  

## 2019-08-30 NOTE — Telephone Encounter (Signed)
Patient is having "very heavy bleeding" and would like to see Dr.Jertson today of possible.

## 2019-08-30 NOTE — ED Provider Notes (Signed)
[facility] EMERGENCY DEPARTMENT Provider Note   CSN: [id number] Arrival date & time: 08/30/19  0935     History   Chief Complaint Chief Complaint  Patient presents with  . Abdominal Cramping    HPI Illyria Sobocinski is a 23 y.o. female.     Patient with history of PCOS, asthma, anemia, blood transfusion presents with lower abdominal cramping worsens yesterday.  Patient has history of similar and vaginal bleeding.  Last menstrual cycle last month was normal for her prolonged.  Patient is not pregnant, no concern for STDs, no vaginal discharge.     Past Medical History:  Diagnosis Date  . Abnormal uterine bleeding   . Asthma   . Hirsutism   . History of anemia   . Hormone disorder   . PCOS (polycystic ovarian syndrome)   . Prediabetes     Patient Active Problem List   Diagnosis Date Noted  . Prediabetes   . PCOS (polycystic ovarian syndrome)   . Anemia 06/24/2018  . Menometrorrhagia 06/23/2018  . Severe anemia 06/23/2018  . Polycystic ovaries 04/29/2016  . Obesity 04/12/2016  . Amenorrhea 04/11/2016  . Postcoital bleeding 04/11/2016  . Vaginitis 04/11/2016    No past surgical history on file.   OB History    Gravida  0   Para  0   Term  0   Preterm  0   AB  0   Living  0     SAB  0   TAB  0   Ectopic  0   Multiple  0   Live Births  0            Home Medications    Prior to Admission medications   Medication Sig Start Date End Date Taking? Authorizing Provider  betamethasone valerate ointment (VALISONE) 0.1 % Use a pea sized amount topically BID for up to 2 weeks as needed. Not for daily long term use. 06/16/19   Salvadore Dom, MD  medroxyPROGESTERone (PROVERA) 5 MG tablet Take one tablet a day for 5 days every month. Start on day 21 of your cycle. 06/16/19   Salvadore Dom, MD  metroNIDAZOLE (FLAGYL) 500 MG tablet Take 1 tablet (500 mg total) by mouth 2 (two) times daily. 06/17/19   Salvadore Dom, MD   nystatin cream (MYCOSTATIN) Apply 1 application topically 2 (two) times daily. Apply to affected area BID for up to 7 days. 06/16/19   Salvadore Dom, MD  valACYclovir (VALTREX) 500 MG tablet Take 1 tablet (500 mg total) by mouth daily. 06/16/19   Salvadore Dom, MD    Family History Family History  Problem Relation Age of Onset  . Diabetes Father     Social History Social History   Tobacco Use  . Smoking status: Never Smoker  . Smokeless tobacco: Never Used  Substance Use Topics  . Alcohol use: Not Currently  . Drug use: Not Currently     Allergies   Fish allergy   Review of Systems Review of Systems  Constitutional: Negative for chills and fever.  HENT: Negative for congestion.   Eyes: Negative for visual disturbance.  Respiratory: Negative for shortness of breath.   Cardiovascular: Negative for chest pain.  Gastrointestinal: Positive for abdominal pain. Negative for vomiting.  Genitourinary: Positive for vaginal bleeding. Negative for dysuria and flank pain.  Musculoskeletal: Negative for back pain, neck pain and neck stiffness.  Skin: Negative for rash.  Neurological: Positive for light-headedness. Negative for  headaches.     Physical Exam Updated Vital Signs BP 131/83 (BP Location: Left Arm)   Pulse 80   Temp 98 F (36.7 C) (Oral)   Resp 16   LMP 08/29/2019   SpO2 100%   Physical Exam Vitals signs and nursing note reviewed.  Constitutional:      Appearance: She is well-developed.  HENT:     Head: Normocephalic and atraumatic.  Eyes:     General:        Right eye: No discharge.        Left eye: No discharge.     Conjunctiva/sclera: Conjunctivae normal.  Neck:     Musculoskeletal: Normal range of motion and neck supple.     Trachea: No tracheal deviation.  Cardiovascular:     Rate and Rhythm: Normal rate and regular rhythm.  Pulmonary:     Effort: Pulmonary effort is normal.     Breath sounds: Normal breath sounds.  Abdominal:      General: There is no distension.     Palpations: Abdomen is soft.     Tenderness: There is abdominal tenderness (mild lower). There is no guarding.  Skin:    General: Skin is warm.     Findings: No rash.  Neurological:     Mental Status: She is alert and oriented to person, place, and time.      ED Treatments / Results  Labs (all labs ordered are listed, but only abnormal results are displayed) Labs Reviewed  CBC - Abnormal; Notable for the following components:      Result Value   Hemoglobin 9.3 (*)    HCT 32.7 (*)    MCV 70.6 (*)    MCH 20.1 (*)    MCHC 28.4 (*)    RDW 19.6 (*)    Platelets 411 (*)    nRBC 0.4 (*)    All other components within normal limits  URINALYSIS, ROUTINE W REFLEX MICROSCOPIC - Abnormal; Notable for the following components:   Color, Urine RED (*)    APPearance TURBID (*)    Glucose, UA   (*)    Value: TEST NOT REPORTED DUE TO COLOR INTERFERENCE OF URINE PIGMENT   Hgb urine dipstick   (*)    Value: TEST NOT REPORTED DUE TO COLOR INTERFERENCE OF URINE PIGMENT   Bilirubin Urine   (*)    Value: TEST NOT REPORTED DUE TO COLOR INTERFERENCE OF URINE PIGMENT   Ketones, ur   (*)    Value: TEST NOT REPORTED DUE TO COLOR INTERFERENCE OF URINE PIGMENT   Protein, ur   (*)    Value: TEST NOT REPORTED DUE TO COLOR INTERFERENCE OF URINE PIGMENT   Nitrite   (*)    Value: TEST NOT REPORTED DUE TO COLOR INTERFERENCE OF URINE PIGMENT   Leukocytes,Ua   (*)    Value: TEST NOT REPORTED DUE TO COLOR INTERFERENCE OF URINE PIGMENT   All other components within normal limits  URINALYSIS, MICROSCOPIC (REFLEX) - Abnormal; Notable for the following components:   Bacteria, UA RARE (*)    All other components within normal limits  URINE CULTURE  COMPREHENSIVE METABOLIC PANEL  LIPASE, BLOOD  I-STAT BETA HCG BLOOD, ED ([facility], [facility], [facility] ONLY)  GC/CHLAMYDIA PROBE AMP ([place]) NOT AT [facility]    EKG None  Radiology US Transvaginal Non-ob  Result Date: 08/30/2019  CLINICAL DATA:  Heavy menstrual bleeding EXAM: TRANSABDOMINAL AND TRANSVAGINAL ULTRASOUND OF PELVIS DOPPLER ULTRASOUND OF OVARIES TECHNIQUE: Both transabdominal and transvaginal  ultrasound examinations of the pelvis were performed. Transabdominal technique was performed for global imaging of the pelvis including uterus, ovaries, adnexal regions, and pelvic cul-de-sac. It was necessary to proceed with endovaginal exam following the transabdominal exam to visualize the ovaries. Color and duplex Doppler ultrasound was utilized to evaluate blood flow to the ovaries. COMPARISON:  06/23/2018 FINDINGS: Uterus Measurements: 8.0 x 4.1 x 4.8 cm = volume: 83 mL. No fibroids or other mass visualized. Endometrium Thickness: 16 mm in thickness.  No focal abnormality visualized. Right ovary Measurements: 4.0 x 1.9 x 3.2 cm = volume: 12.5 mL. Normal appearance/no adnexal mass. Left ovary Measurements: 2.9 x 1.9 x 1.1 cm = volume: 3.1 mL. Normal appearance/no adnexal mass. Pulsed Doppler evaluation of both ovaries demonstrates normal low-resistance arterial and venous waveforms. Other findings No abnormal free fluid. IMPRESSION: Endometrium borderline in thickness at 16 mm. If bleeding remains unresponsive to hormonal or medical therapy, focal lesion work-up with sonohysterogram should be considered. Endometrial biopsy should also be considered in pre-menopausal patients at high risk for endometrial carcinoma. (Ref: Radiological Reasoning: Algorithmic Workup of Abnormal Vaginal Bleeding with Endovaginal Sonography and Sonohysterography. AJR 2008; 742:V95-63; 191:S68-73) Electronically Signed   By: Charlett NoseKevin  Dover M.D.   On: 08/30/2019 12:40   [place] Pelvis Complete  Result Date: 08/30/2019 CLINICAL DATA:  Heavy menstrual bleeding EXAM: TRANSABDOMINAL AND TRANSVAGINAL ULTRASOUND OF PELVIS DOPPLER ULTRASOUND OF OVARIES TECHNIQUE: Both transabdominal and transvaginal ultrasound examinations of the pelvis were performed. Transabdominal technique was  performed for global imaging of the pelvis including uterus, ovaries, adnexal regions, and pelvic cul-de-sac. It was necessary to proceed with endovaginal exam following the transabdominal exam to visualize the ovaries. Color and duplex Doppler ultrasound was utilized to evaluate blood flow to the ovaries. COMPARISON:  06/23/2018 FINDINGS: Uterus Measurements: 8.0 x 4.1 x 4.8 cm = volume: 83 mL. No fibroids or other mass visualized. Endometrium Thickness: 16 mm in thickness.  No focal abnormality visualized. Right ovary Measurements: 4.0 x 1.9 x 3.2 cm = volume: 12.5 mL. Normal appearance/no adnexal mass. Left ovary Measurements: 2.9 x 1.9 x 1.1 cm = volume: 3.1 mL. Normal appearance/no adnexal mass. Pulsed Doppler evaluation of both ovaries demonstrates normal low-resistance arterial and venous waveforms. Other findings No abnormal free fluid. IMPRESSION: Endometrium borderline in thickness at 16 mm. If bleeding remains unresponsive to hormonal or medical therapy, focal lesion work-up with sonohysterogram should be considered. Endometrial biopsy should also be considered in pre-menopausal patients at high risk for endometrial carcinoma. (Ref: Radiological Reasoning: Algorithmic Workup of Abnormal Vaginal Bleeding with Endovaginal Sonography and Sonohysterography. AJR 2008; 875:I43-32; 191:S68-73) Electronically Signed   By: Charlett NoseKevin  Dover M.D.   On: 08/30/2019 12:40   [place] Art/ven Flow Abd Pelv Doppler  Result Date: 08/30/2019 CLINICAL DATA:  Heavy menstrual bleeding EXAM: TRANSABDOMINAL AND TRANSVAGINAL ULTRASOUND OF PELVIS DOPPLER ULTRASOUND OF OVARIES TECHNIQUE: Both transabdominal and transvaginal ultrasound examinations of the pelvis were performed. Transabdominal technique was performed for global imaging of the pelvis including uterus, ovaries, adnexal regions, and pelvic cul-de-sac. It was necessary to proceed with endovaginal exam following the transabdominal exam to visualize the ovaries. Color and duplex Doppler  ultrasound was utilized to evaluate blood flow to the ovaries. COMPARISON:  06/23/2018 FINDINGS: Uterus Measurements: 8.0 x 4.1 x 4.8 cm = volume: 83 mL. No fibroids or other mass visualized. Endometrium Thickness: 16 mm in thickness.  No focal abnormality visualized. Right ovary Measurements: 4.0 x 1.9 x 3.2 cm = volume: 12.5 mL. Normal appearance/no adnexal mass. Left ovary Measurements: 2.9  x 1.9 x 1.1 cm = volume: 3.1 mL. Normal appearance/no adnexal mass. Pulsed Doppler evaluation of both ovaries demonstrates normal low-resistance arterial and venous waveforms. Other findings No abnormal free fluid. IMPRESSION: Endometrium borderline in thickness at 16 mm. If bleeding remains unresponsive to hormonal or medical therapy, focal lesion work-up with sonohysterogram should be considered. Endometrial biopsy should also be considered in pre-menopausal patients at high risk for endometrial carcinoma. (Ref: Radiological Reasoning: Algorithmic Workup of Abnormal Vaginal Bleeding with Endovaginal Sonography and Sonohysterography. AJR 2008; 025:K27-06) Electronically Signed   By: Charlett Nose M.D.   On: 08/30/2019 12:40    Procedures Procedures (including critical care time)  Medications Ordered in ED Medications  acetaminophen (TYLENOL) tablet 1,000 mg (has no administration in time range)  sodium chloride 0.9 % bolus 1,000 mL (1,000 mLs Intravenous New Bag/Given 08/30/19 1155)     Initial Impression / Assessment and Plan / ED Course  I have reviewed the triage vital signs and the nursing notes.  Pertinent labs & imaging results that were available during my care of the patient were reviewed by me and considered in my medical decision making (see chart for details).       Patient improved on reassessment.  Tylenol ordered for pain.  IV fluids going.  Reviewed blood work hemoglobin around 9, patient has mild symptomatic anemia however no indication for emergent transfusion at this time.  Ultrasound  results reviewed no torsion, no cyst, mild thickening discussed importance of follow-up with gynecology for this abnormal ultrasound.  Remaining blood work pending.  Urinalysis significant blood, patient has no urinary symptoms.  Culture added.  Patient does not want a pelvic exam at this time not concerned for STDs no vaginal discharge.  Plan for close outpatient follow-up.    Final Clinical Impressions(s) / ED Diagnoses   Final diagnoses:  Abdominal cramping  Vaginal bleeding  Lightheaded  Symptomatic anemia    ED Discharge Orders    None       Blane Ohara, MD 08/30/19 1310

## 2019-08-30 NOTE — Telephone Encounter (Signed)
Call to patient. Patient states she has had "really bad cramping pain" for 4-5 days now, and her cycle started yesterday. Changing a saturated pad every 30 minutes. States, "I can't do anything." Complaining of cramping pain 8/10. Did take 400 mg of ibuprofen at 0300 with no relief. Condom use "every time" for contraception. Patient also complaining of headache, light-headedness and dizziness. Patient requesting to be seen. RN advised Dr. Talbert Nan currently in surgery and unable to provide an exact time of when she would be back in the office following surgery. Reviewed with Raliegh Ip Sprague, RN and J. Hamm, RN. Patient advised due to heavy bleeding and anemia symptoms, patient needed to be seen in ER. Patient verbalized understanding. Patient will call to give office update after being seen in ER.   Routing to provider and will close encounter.

## 2019-08-31 LAB — URINE CULTURE

## 2019-09-21 DIAGNOSIS — Z20828 Contact with and (suspected) exposure to other viral communicable diseases: Secondary | ICD-10-CM | POA: Diagnosis not present

## 2019-09-21 DIAGNOSIS — Z6837 Body mass index (BMI) 37.0-37.9, adult: Secondary | ICD-10-CM | POA: Diagnosis not present

## 2019-12-29 ENCOUNTER — Telehealth: Payer: Self-pay | Admitting: Obstetrics and Gynecology

## 2019-12-29 NOTE — Telephone Encounter (Signed)
Patient would like to discuss starting birth control

## 2019-12-29 NOTE — Telephone Encounter (Signed)
Call to patient, no answer, voicemail not set up.

## 2020-01-04 NOTE — Telephone Encounter (Signed)
Attempted to call pt, No voicemail box set up and no alt number to call.

## 2020-01-11 NOTE — Telephone Encounter (Signed)
Call to patient x2, no return call. MyChart not active.   Encounter closed.   Routing to Dr. Jertson FYI.  

## 2020-04-23 IMAGING — US US TRANSVAGINAL NON-OB
1 series · 13 of 25 positions shown · non-contrast
Comparison: 06/23/2018

CLINICAL DATA: Heavy menstrual bleeding

EXAM:
TRANSABDOMINAL AND TRANSVAGINAL ULTRASOUND OF PELVIS
DOPPLER ULTRASOUND OF OVARIES
TECHNIQUE: Both transabdominal and transvaginal ultrasound examinations of the
pelvis were performed. Transabdominal technique was performed for
global imaging of the pelvis including uterus, ovaries, adnexal
regions, and pelvic cul-de-sac.
It was necessary to proceed with endovaginal exam following the
transabdominal exam to visualize the ovaries. Color and duplex
Doppler ultrasound was utilized to evaluate blood flow to the
ovaries.

[Series 1: us transvaginal non-ob · 13 of 76 slices shown]
[im 1/76]
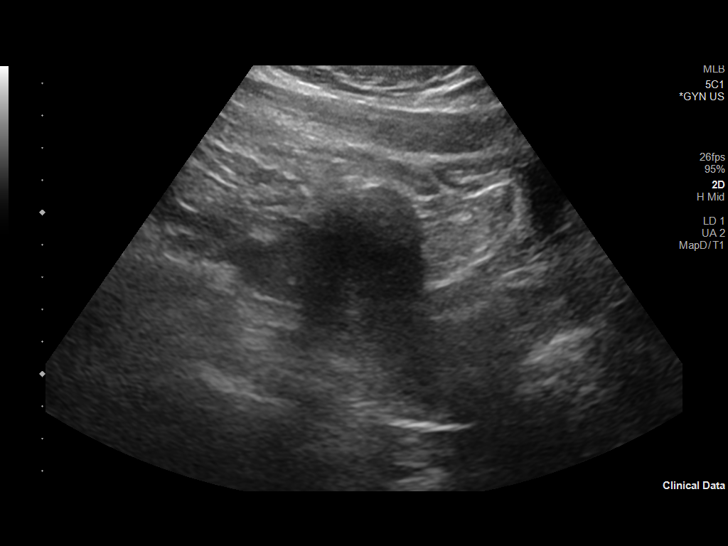
[im 7/76]
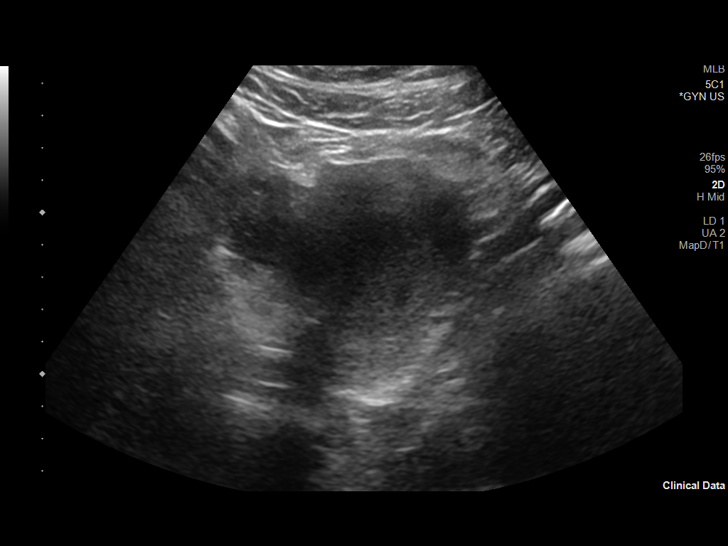
[im 13/76]
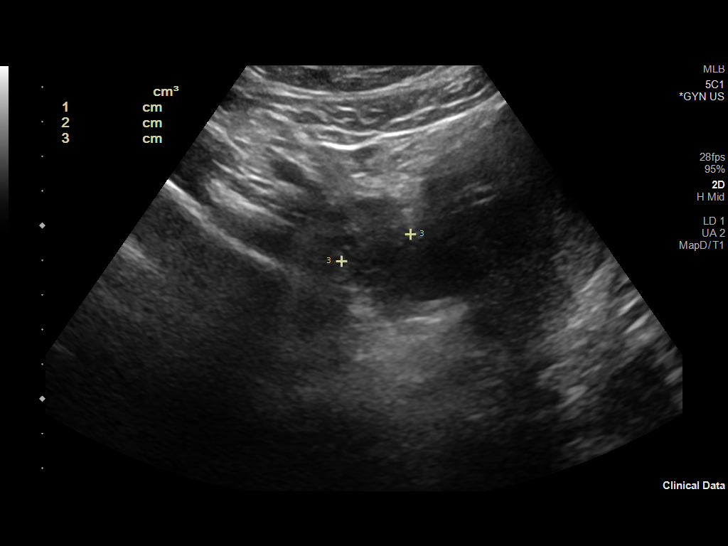
[im 19/76]
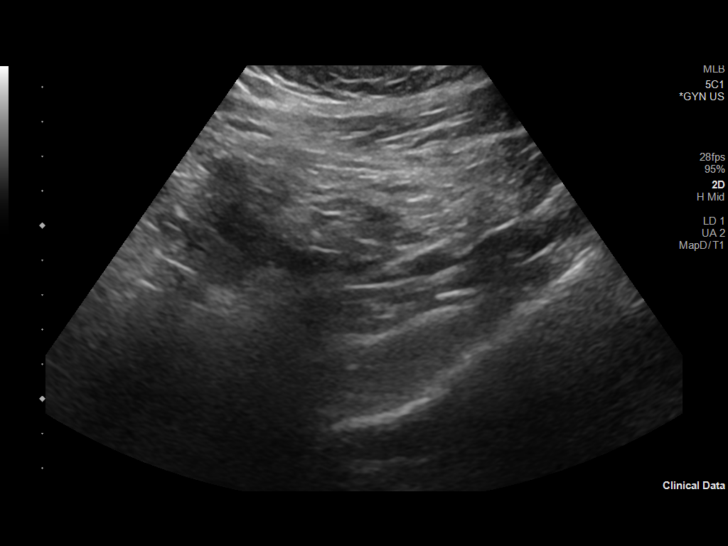
[im 26/76]
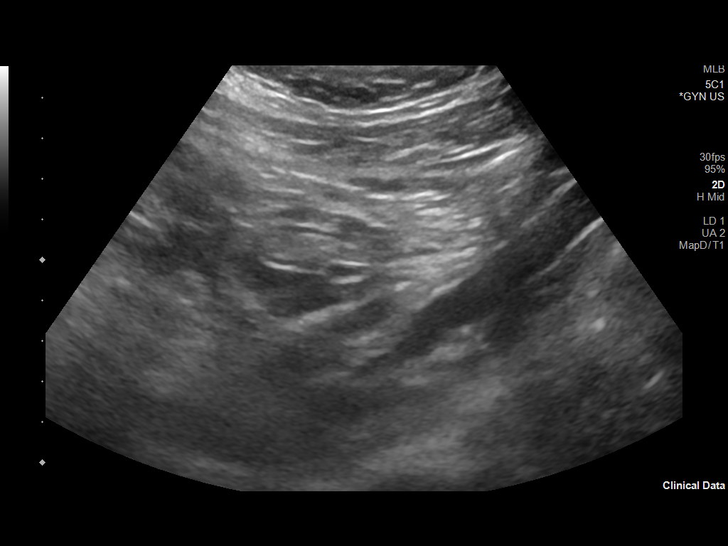
[im 32/76]
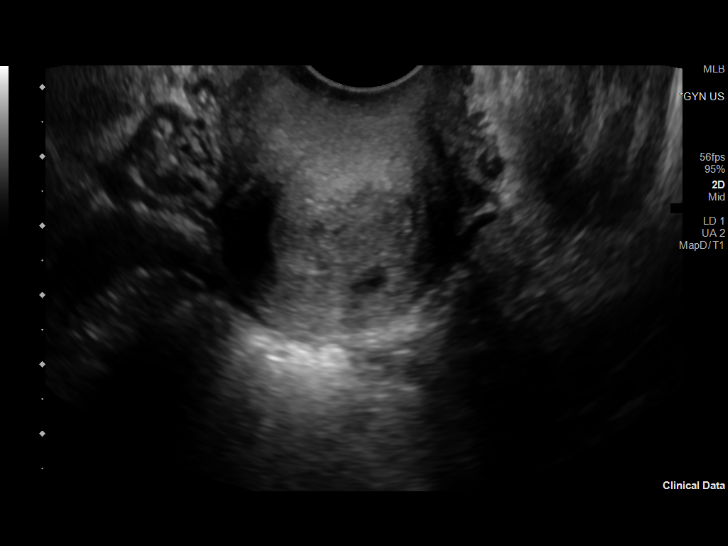
[im 38/76]
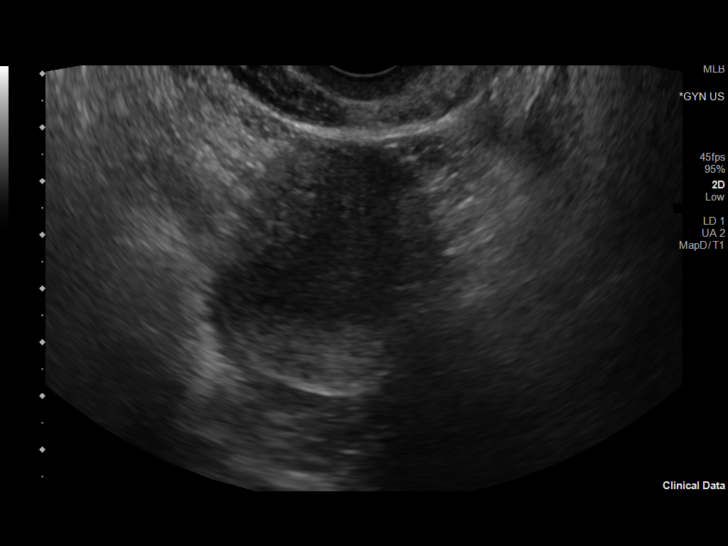
[im 44/76]
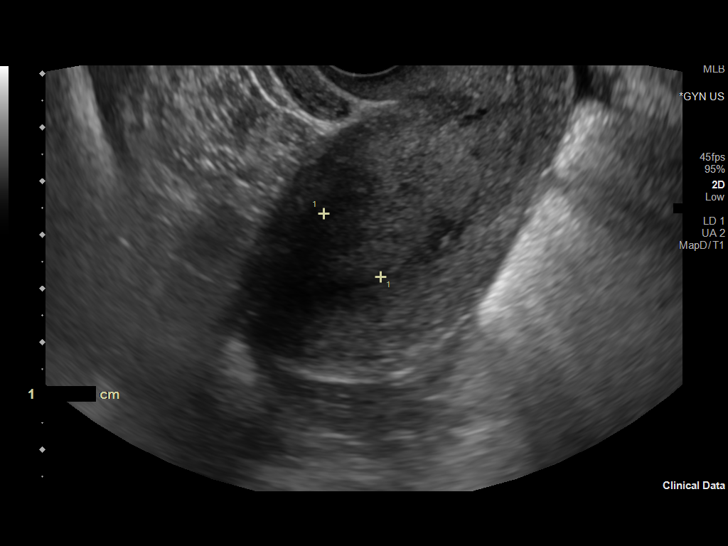
[im 51/76]
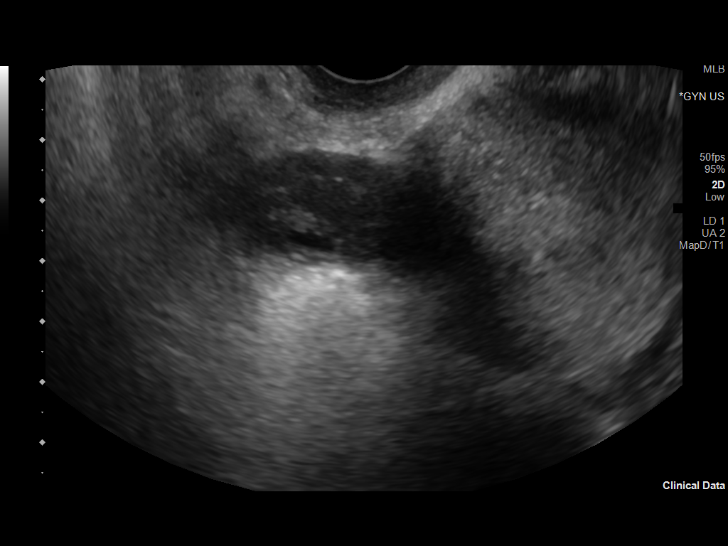
[im 57/76]
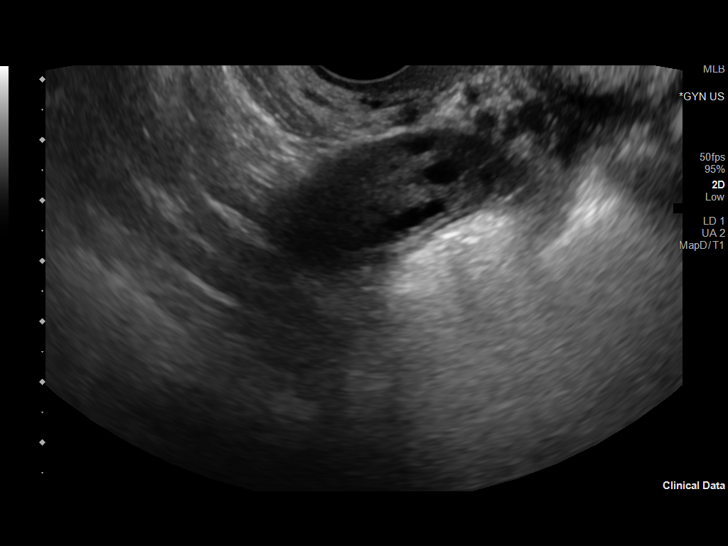
[im 63/76]
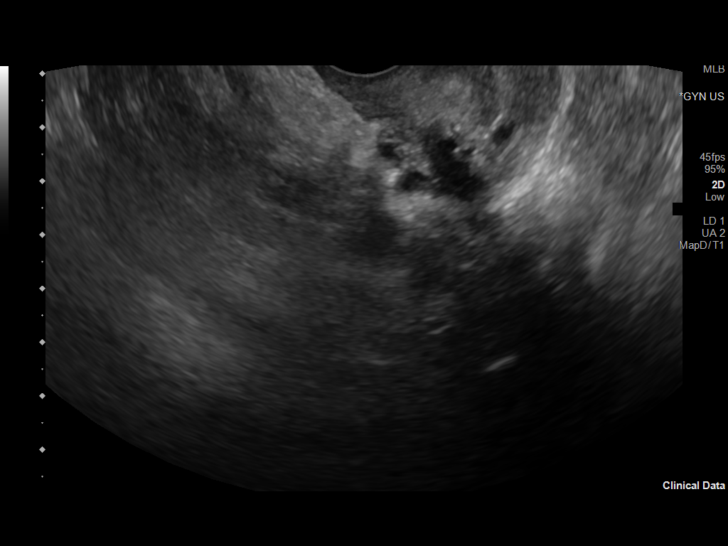
[im 69/76]
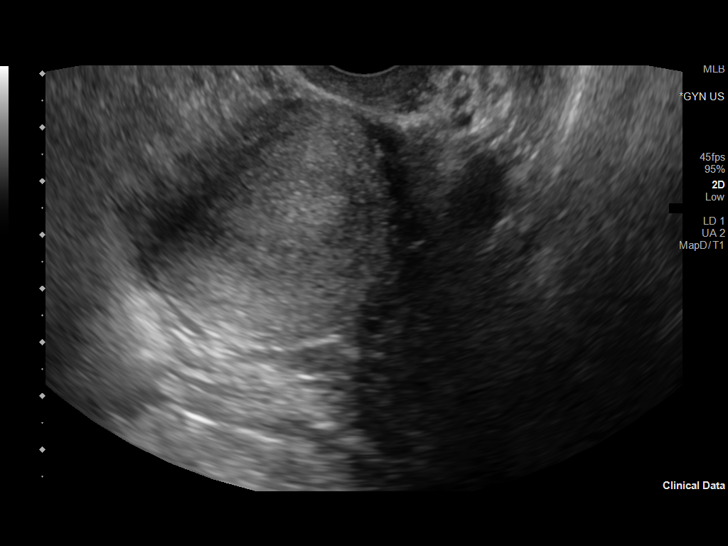
[im 76/76]
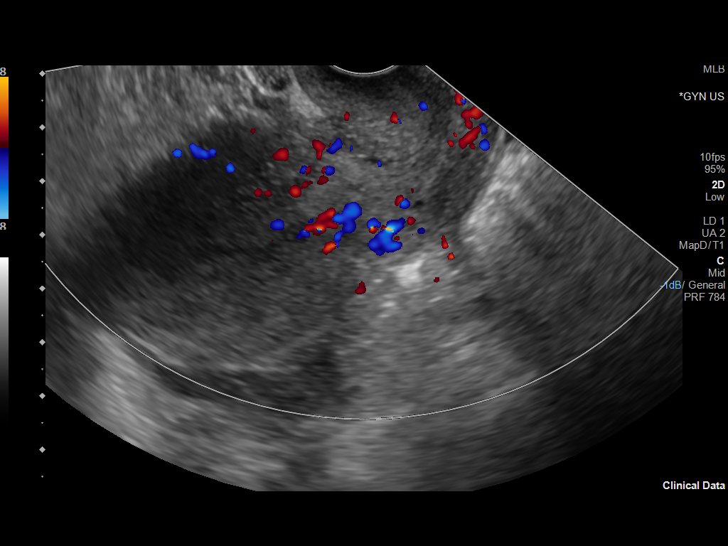

[13 of 25 positions shown; findings below may reference images not displayed]

FINDINGS: Uterus

Measurements: 8.0 x 4.1 x 4.8 cm = volume: 83 mL. No fibroids or
other mass visualized.

Endometrium

Thickness: 16 mm in thickness.  No focal abnormality visualized.

Right ovary

Measurements: 4.0 x 1.9 x 3.2 cm = volume: 12.5 mL. Normal
appearance/no adnexal mass.

Left ovary

Measurements: 2.9 x 1.9 x 1.1 cm = volume: 3.1 mL. Normal
appearance/no adnexal mass.

Pulsed Doppler evaluation of both ovaries demonstrates normal
low-resistance arterial and venous waveforms.

Other findings

No abnormal free fluid.
IMPRESSION: Endometrium borderline in thickness at 16 mm. If bleeding remains
unresponsive to hormonal or medical therapy, focal lesion work-up
with sonohysterogram should be considered. Endometrial biopsy should
also be considered in pre-menopausal patients at high risk for
endometrial carcinoma. (Ref: Radiological Reasoning: Algorithmic
Workup of Abnormal Vaginal Bleeding with Endovaginal Sonography and
Sonohysterography. AJR 7991; 191:S68-73)

## 2020-06-13 ENCOUNTER — Telehealth: Payer: Self-pay

## 2020-06-13 NOTE — Telephone Encounter (Signed)
Patient is calling in regards to having irregular bleeding. Patient has an AEX on (06/21/20) and would like to know what she can do before then.

## 2020-06-13 NOTE — Telephone Encounter (Signed)
AEX 05/2019, next scheduled for 06/21/20 H/O anemia H/o D&C with blood transfusion 06/2018 H/o PCOS Irregular menses, episodes of anovulation  Spoke with pt. Pt states having another elongated cycle this month. Pt states has been having irregular cycles for about a year now. Pt states started this month on 06/03/20 and still having regular "her heavy normal flow" with changing a pad/tampon every 3 hours. Pt denies feeling light headed, dizzy, weak at this time. Pt denies any abd pain/cramps, or clots or soaking pads.  Pt is currently SA and not using any protection or contraception. Pt states is still considering birth control for pregnancy prevention and cycle regulation. Pt unsure what to use, will discuss at AEX.  Pt advised to keep AEX scheduled for next Wed and to calender/diary cycles to discuss with Dr Oscar La. Pt agreeable. Pt advised to seek ER if having heavy soaking pads every hour or less or having severe abd pain or feeling weak, dizzy, lightheaded due to past hx. Pt agreeable and verbalized understanding.  Pt advised will review with Dr Oscar La and return call with any additional recommendations or advice. Pt agreeable.   Routing to Dr Oscar La

## 2020-06-16 ENCOUNTER — Other Ambulatory Visit: Payer: Self-pay | Admitting: Obstetrics and Gynecology

## 2020-06-19 NOTE — Telephone Encounter (Signed)
Medication refill request: Valtrex Last AEX:  06/16/19 JJ Next AEX: 06/21/20 Last MMG (if hormonal medication request): n/a Refill authorized: Today, please advise

## 2020-06-19 NOTE — Progress Notes (Signed)
24 y.o. G0P0000 Single Black or African American Not Hispanic or Latino female here for annual exam. Patient has PCOS and she is miserable  would like to discuss metformin and birth control with symptoms.   H/o PCOS, anovulation, hirsutism and anemia. In the past she had trouble remembering the pill as well as nausea.  Last year she was given cyclic provera, never took it.  From 8/20-2/21 no cycles. Then she started having monthly cycles in 2/21 x 1 week. In June she didn't have a real cycle, just irregular spotting. In July she started bleeding on the 17 and bleed x 14 days. Even when she has monthly cycles they are heavy, she can saturate a pad in 3 hours.  She has some baseline fatigue, not light headed.  Sexually active, same partner x 3 years, using condoms. No dyspareunia. She is having skin issue.  She feels her hirsutism has worsened.  Prior normal gyn ultrasound.   Period Duration (Days): 14 Period Pattern: (!) Irregular Menstrual Flow: Heavy Menstrual Control: Maxi pad (She has clotting) Menstrual Control Change Freq (Hours): 3-4 Dysmenorrhea: (!) Severe Dysmenorrhea Symptoms: Headache, Cramping, Nausea (Craming is mild)  Patient's last menstrual period was 06/03/2020 (exact date).          Sexually active: Yes.    The current method of family planning is condoms Always.    Exercising: No.  The patient does not participate in regular exercise at present. Smoker:  no  Health Maintenance: Pap:  718/20 WNL History of abnormal Pap:  no TDaP:  Up to date per patient  Gardasil: complete per patient    reports that she has never smoked. She has never used smokeless tobacco. She reports previous alcohol use. She reports previous drug use. Rare ETOH. Works for a [employer]. Lives on her own.   Past Medical History:  Diagnosis Date  . Abnormal uterine bleeding   . Asthma   . Hirsutism   . History of anemia   . Hormone disorder   . PCOS (polycystic ovarian syndrome)    . Prediabetes     History reviewed. No pertinent surgical history.  Current Outpatient Medications  Medication Sig Dispense Refill  . ibuprofen (ADVIL) 200 MG tablet Take 400 mg by mouth every 6 (six) hours as needed for mild pain.    . valACYclovir (VALTREX) 500 MG tablet TAKE 1 TABLET BY MOUTH EVERY DAY 90 tablet 0   No current facility-administered medications for this visit.   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 Diabetes Father     Review of Systems  All other systems reviewed and are negative.   Exam:   BP 112/60   Pulse 87   Ht 5' 6.25" (1.683 m)   Wt 236 lb (107 kg)   LMP 06/03/2020 (Exact Date)   SpO2 97%   BMI 37.80 kg/m   Weight change: @WEIGHTCHANGE @ Height:   Height: 5' 6.25" (168.3 cm)  Ht Readings from Last 3 Encounters:  06/21/20 5' 6.25" (1.683 m)  06/16/19 5\' 6"  (1.676 m)  06/04/18 5' 5.75" (1.67 m)    General appearance: alert, cooperative and appears stated age Head: Normocephalic, without obvious abnormality, atraumatic Neck: no adenopathy, supple, symmetrical, trachea midline and thyroid normal to inspection and palpation Lungs: clear to auscultation bilaterally Cardiovascular: regular rate and rhythm Breasts: normal appearance, no masses or tenderness Abdomen: soft, non-tender;  non distended,  no masses,  no organomegaly Extremities: extremities normal, atraumatic, no cyanosis or edema Skin: erythematous rash on her neck, severe erythematous rash between and under her breasts bilaterally. Hirsutism, marked on her chin.  Lymph nodes: Cervical, supraclavicular, and axillary nodes normal. No abnormal inguinal nodes palpated Neurologic: Grossly normal   Pelvic: External genitalia:  no lesions              Urethra:  normal appearing urethra with no masses, tenderness or lesions              Bartholins and Skenes: normal                 Vagina:  normal appearing vagina with normal color and discharge, no lesions              Cervix: no lesions               Bimanual Exam:  Uterus:  no masses or tenderness              Adnexa: no mass, fullness, tenderness               Rectovaginal: deferred  Carolynn Serve chaperoned for the exam.  A:  Well Woman with normal exam  BMI 37  Hirsutism  PCOS  Candida intertrigo  Prediabetes  H/O anemia  Long h/o anovulation, menometrorrhagia   P:   No pap needed  Denies STD testing  Screening labs, TSH, HgbA1C, BhcG, ferritin  Start yaz, no contraindications, risks reviewed  F/U in 3 months  Nystatin for candida (Dermatology # also given)  Reviewed breast self exam  Start Yaz  I would not recommend metformin for her PCOS, will see what her HgbA1C is.

## 2020-06-21 ENCOUNTER — Ambulatory Visit (INDEPENDENT_AMBULATORY_CARE_PROVIDER_SITE_OTHER): Payer: BC Managed Care – PPO | Admitting: Obstetrics and Gynecology

## 2020-06-21 ENCOUNTER — Other Ambulatory Visit: Payer: Self-pay

## 2020-06-21 ENCOUNTER — Encounter: Payer: Self-pay | Admitting: Obstetrics and Gynecology

## 2020-06-21 VITALS — BP 112/60 | HR 87 | Ht 66.25 in | Wt 236.0 lb

## 2020-06-21 DIAGNOSIS — Z0000 Encounter for general adult medical examination without abnormal findings: Secondary | ICD-10-CM

## 2020-06-21 DIAGNOSIS — Z01419 Encounter for gynecological examination (general) (routine) without abnormal findings: Secondary | ICD-10-CM | POA: Diagnosis not present

## 2020-06-21 DIAGNOSIS — Z862 Personal history of diseases of the blood and blood-forming organs and certain disorders involving the immune mechanism: Secondary | ICD-10-CM

## 2020-06-21 DIAGNOSIS — N921 Excessive and frequent menstruation with irregular cycle: Secondary | ICD-10-CM

## 2020-06-21 DIAGNOSIS — R7303 Prediabetes: Secondary | ICD-10-CM | POA: Diagnosis not present

## 2020-06-21 DIAGNOSIS — Z6837 Body mass index (BMI) 37.0-37.9, adult: Secondary | ICD-10-CM

## 2020-06-21 DIAGNOSIS — Z3009 Encounter for other general counseling and advice on contraception: Secondary | ICD-10-CM

## 2020-06-21 DIAGNOSIS — E282 Polycystic ovarian syndrome: Secondary | ICD-10-CM

## 2020-06-21 DIAGNOSIS — B372 Candidiasis of skin and nail: Secondary | ICD-10-CM

## 2020-06-21 DIAGNOSIS — L680 Hirsutism: Secondary | ICD-10-CM

## 2020-06-21 MED ORDER — DROSPIRENONE-ETHINYL ESTRADIOL 3-0.02 MG PO TABS: 1.0000 | ORAL_TABLET | Freq: Every day | ORAL | 0 refills | Status: DC

## 2020-06-21 MED ORDER — NYSTATIN 100000 UNIT/GM EX CREA: 1.0000 "application " | TOPICAL_CREAM | Freq: Two times a day (BID) | CUTANEOUS | 0 refills | Status: DC

## 2020-06-21 NOTE — Patient Instructions (Signed)
EXERCISE AND DIET:  We recommended that you start or continue a regular exercise program for good health. Regular exercise means any activity that makes your heart beat faster and makes you sweat.  We recommend exercising at least 30 minutes per day at least 3 days a week, preferably 4 or 5.  We also recommend a diet low in fat and sugar.  Inactivity, poor dietary choices and obesity can cause diabetes, heart attack, stroke, and kidney damage, among others.    ALCOHOL AND SMOKING:  Women should limit their alcohol intake to no more than 7 drinks/beers/glasses of wine (combined, not each!) per week. Moderation of alcohol intake to this level decreases your risk of breast cancer and liver damage. And of course, no recreational drugs are part of a healthy lifestyle.  And absolutely no smoking or even second hand smoke. Most people know smoking can cause heart and lung diseases, but did you know it also contributes to weakening of your bones? Aging of your skin?  Yellowing of your teeth and nails?  CALCIUM AND VITAMIN D:  Adequate intake of calcium and Vitamin D are recommended.  The recommendations for exact amounts of these supplements seem to change often, but generally speaking 1,000 mg of calcium (between diet and supplement) and 800 units of Vitamin D per day seems prudent. Certain women may benefit from higher intake of Vitamin D.  If you are among these women, your doctor will have told you during your visit.    PAP SMEARS:  Pap smears, to check for cervical cancer or precancers,  have traditionally been done yearly, although recent scientific advances have shown that most women can have pap smears less often.  However, every woman still should have a physical exam from her gynecologist every year. It will include a breast check, inspection of the vulva and vagina to check for abnormal growths or skin changes, a visual exam of the cervix, and then an exam to evaluate the size and shape of the uterus and  ovaries.  And after 25 years of age, a rectal exam is indicated to check for rectal cancers. We will also provide age appropriate advice regarding health maintenance, like when you should have certain vaccines, screening for sexually transmitted diseases, bone density testing, colonoscopy, mammograms, etc.   MAMMOGRAMS:  All women over 40 years old should have a yearly mammogram. Many facilities now offer a "3D" mammogram, which may cost around $50 extra out of pocket. If possible,  we recommend you accept the option to have the 3D mammogram performed.  It both reduces the number of women who will be called back for extra views which then turn out to be normal, and it is better than the routine mammogram at detecting truly abnormal areas.    COLON CANCER SCREENING: Now recommend starting at age 45. At this time colonoscopy is not covered for routine screening until 50. There are take home tests that can be done between 45-49.   COLONOSCOPY:  Colonoscopy to screen for colon cancer is recommended for all women at age 50.  We know, you hate the idea of the prep.  We agree, BUT, having colon cancer and not knowing it is worse!!  Colon cancer so often starts as a polyp that can be seen and removed at colonscopy, which can quite literally save your life!  And if your first colonoscopy is normal and you have no family history of colon cancer, most women don't have to have it again for   10 years.  Once every ten years, you can do something that may end up saving your life, right?  We will be happy to help you get it scheduled when you are ready.  Be sure to check your insurance coverage so you understand how much it will cost.  It may be covered as a preventative service at no cost, but you should check your particular policy.      Breast Self-Awareness Breast self-awareness means being familiar with how your breasts look and feel. It involves checking your breasts regularly and reporting any changes to your  health care provider. Practicing breast self-awareness is important. A change in your breasts can be a sign of a serious medical problem. Being familiar with how your breasts look and feel allows you to find any problems early, when treatment is more likely to be successful. All women should practice breast self-awareness, including women who have had breast implants. How to do a breast self-exam One way to learn what is normal for your breasts and whether your breasts are changing is to do a breast self-exam. To do a breast self-exam: Look for Changes  1. Remove all the clothing above your waist. 2. Stand in front of a mirror in a room with good lighting. 3. Put your hands on your hips. 4. Push your hands firmly downward. 5. Compare your breasts in the mirror. Look for differences between them (asymmetry), such as: ? Differences in shape. ? Differences in size. ? Puckers, dips, and bumps in one breast and not the other. 6. Look at each breast for changes in your skin, such as: ? Redness. ? Scaly areas. 7. Look for changes in your nipples, such as: ? Discharge. ? Bleeding. ? Dimpling. ? Redness. ? A change in position. Feel for Changes Carefully feel your breasts for lumps and changes. It is best to do this while lying on your back on the floor and again while sitting or standing in the shower or tub with soapy water on your skin. Feel each breast in the following way:  Place the arm on the side of the breast you are examining above your head.  Feel your breast with the other hand.  Start in the nipple area and make  inch (2 cm) overlapping circles to feel your breast. Use the pads of your three middle fingers to do this. Apply light pressure, then medium pressure, then firm pressure. The light pressure will allow you to feel the tissue closest to the skin. The medium pressure will allow you to feel the tissue that is a little deeper. The firm pressure will allow you to feel the tissue  close to the ribs.  Continue the overlapping circles, moving downward over the breast until you feel your ribs below your breast.  Move one finger-width toward the center of the body. Continue to use the  inch (2 cm) overlapping circles to feel your breast as you move slowly up toward your collarbone.  Continue the up and down exam using all three pressures until you reach your armpit.  Write Down What You Find  Write down what is normal for each breast and any changes that you find. Keep a written record with breast changes or normal findings for each breast. By writing this information down, you do not need to depend only on memory for size, tenderness, or location. Write down where you are in your menstrual cycle, if you are still menstruating. If you are having trouble noticing differences   in your breasts, do not get discouraged. With time you will become more familiar with the variations in your breasts and more comfortable with the exam. How often should I examine my breasts? Examine your breasts every month. If you are breastfeeding, the best time to examine your breasts is after a feeding or after using a breast pump. If you menstruate, the best time to examine your breasts is 5-7 days after your period is over. During your period, your breasts are lumpier, and it may be more difficult to notice changes. When should I see my health care provider? See your health care provider if you notice:  A change in shape or size of your breasts or nipples.  A change in the skin of your breast or nipples, such as a reddened or scaly area.  Unusual discharge from your nipples.  A lump or thick area that was not there before.  Pain in your breasts.  Anything that concerns you.  Oral Contraception Information Oral contraceptive pills (OCPs) are medicines taken to prevent pregnancy. OCPs are taken by mouth, and they work by:  Preventing the ovaries from releasing eggs.  Thickening mucus in  the lower part of the uterus (cervix), which prevents sperm from entering the uterus.  Thinning the lining of the uterus (endometrium), which prevents a fertilized egg from attaching to the endometrium. OCPs are highly effective when taken exactly as prescribed. However, OCPs do not prevent STIs (sexually transmitted infections). Safe sex practices, such as using condoms while on an OCP, can help prevent STIs. Before starting OCPs Before you start taking OCPs, you may have a physical exam, blood test, and Pap test. However, you are not required to have a pelvic exam in order to be prescribed OCPs. Your health care provider will make sure you are a good candidate for oral contraception. OCPs are not a good option for certain women, including women who smoke and are older than 35 years, and women with a medical history of high blood pressure, deep vein thrombosis, pulmonary embolism, stroke, cardiovascular disease, or peripheral vascular disease. Discuss with your health care provider the possible side effects of the OCP you may be prescribed. When you start an OCP, be aware that it can take 2-3 months for your body to adjust to changes in hormone levels. Follow instructions from your health care provider about how to start taking your first cycle of OCPs. Depending on when you start the pill, you may need to use a backup form of birth control, such as condoms, during the first week. Make sure you know what steps to take if you ever forget to take the pill. Types of oral contraception  The most common types of birth control pills contain the hormones estrogen and progestin (synthetic progesterone) or progestin only. The combination pill This type of pill contains estrogen and progestin hormones. Combination pills often come in packs of 21, 28, or 91 pills. For each pack, the last 7 pills may not contain hormones, which means you may stop taking the pills for 7 days. Menstrual bleeding occurs during the  week that you do not take the pills or that you take the pills with no hormones in them. The minipill This type of pill contains the progestin hormone only. It comes in packs of 28 pills. All 28 pills contain the hormone. You take the pill every day. It is very important to take the pill at the same time each day. Advantages of oral contraceptive pills    Provides reliable and continuous contraception if taken as instructed.  May treat or decrease symptoms of: ? Menstrual period cramps. ? Irregular menstrual cycle or bleeding. ? Heavy menstrual flow. ? Abnormal uterine bleeding. ? Acne, depending on the type of pill. ? Polycystic ovarian syndrome. ? Endometriosis. ? Iron deficiency anemia. ? Premenstrual symptoms, including premenstrual dysphoric disorder.  May reduce the risk of endometrial and ovarian cancer.  Can be used as emergency contraception.  Prevents mislocated (ectopic) pregnancies and infections of the fallopian tubes. Things that can make oral contraceptive pills less effective OCPs can be less effective if:  You forget to take the pill at the same time every day. This is especially important when taking the minipill.  You have a stomach or intestinal disease that reduces your body's ability to absorb the pill.  You take OCPs with other medicines that make OCPs less effective, such as antibiotics, certain HIV medicines, and some seizure medicines.  You take expired OCPs.  You forget to restart the pill on day 7, if using the packs of 21 pills. Risks associated with oral contraceptive pills Oral contraceptive pills can sometimes cause side effects, such as:  Headache.  Depression.  Trouble sleeping.  Nausea and vomiting.  Breast tenderness.  Irregular bleeding or spotting during the first several months.  Bloating or fluid retention.  Increase in blood pressure. Combination pills are also associated with a small increase in the risk of:  Blood  clots.  Heart attack.  Stroke. Summary  Oral contraceptive pills are medicines taken by mouth to prevent pregnancy. They are highly effective when taken exactly as prescribed.  The most common types of birth control pills contain the hormones estrogen and progestin (synthetic progesterone) or progestin only.  Before you start taking the pill, you may have a physical exam, blood test, and Pap test. Your health care provider will make sure you are a good candidate for oral contraception.  The combination pill may come in a 21-day pack, a 28-day pack, or a 91-day pack. The minipill contains the progesterone hormone only and comes in packs of 28 pills.  Oral contraceptive pills can sometimes cause side effects, such as headache, nausea, breast tenderness, or irregular bleeding. This information is not intended to replace advice given to you by your health care provider. Make sure you discuss any questions you have with your health care provider. Document Revised: 10/17/2017 Document Reviewed: 01/28/2017 Elsevier Patient Education  2020 [employer]  

## 2020-06-22 LAB — CBC
Hematocrit: 33.5 % — ABNORMAL LOW (ref 34.0–46.6)
Hemoglobin: 9.8 g/dL — ABNORMAL LOW (ref 11.1–15.9)
MCH: 20.4 pg — ABNORMAL LOW (ref 26.6–33.0)
MCHC: 29.3 g/dL — ABNORMAL LOW (ref 31.5–35.7)
MCV: 70 fL — ABNORMAL LOW (ref 79–97)
Platelets: 471 10*3/uL — ABNORMAL HIGH (ref 150–450)
RBC: 4.8 x10E6/uL (ref 3.77–5.28)
RDW: 21.3 % — ABNORMAL HIGH (ref 11.7–15.4)
WBC: 5.9 10*3/uL (ref 3.4–10.8)

## 2020-06-22 LAB — LIPID PANEL
Chol/HDL Ratio: 4.7 ratio — ABNORMAL HIGH (ref 0.0–4.4)
Cholesterol, Total: 196 mg/dL (ref 100–199)
HDL: 42 mg/dL (ref >39)
LDL Chol Calc (NIH): 133 mg/dL — ABNORMAL HIGH (ref 0–99)
Triglycerides: 114 mg/dL (ref 0–149)
VLDL Cholesterol Cal: 21 mg/dL (ref 5–40)

## 2020-06-22 LAB — COMPREHENSIVE METABOLIC PANEL
ALT: 16 IU/L (ref 0–32)
AST: 14 IU/L (ref 0–40)
Albumin/Globulin Ratio: 1.3 (ref 1.2–2.2)
Albumin: 4 g/dL (ref 3.9–5.0)
Alkaline Phosphatase: 74 IU/L (ref 48–121)
BUN/Creatinine Ratio: 14 (ref 9–23)
BUN: 10 mg/dL (ref 6–20)
Bilirubin Total: <0.2 mg/dL (ref 0.0–1.2)
CO2: 23 mmol/L (ref 20–29)
Calcium: 9 mg/dL (ref 8.7–10.2)
Chloride: 107 mmol/L — ABNORMAL HIGH (ref 96–106)
Creatinine, Ser: 0.7 mg/dL (ref 0.57–1.00)
GFR calc Af Amer: 139 mL/min/{1.73_m2} (ref >59)
GFR calc non Af Amer: 121 mL/min/{1.73_m2} (ref >59)
Globulin, Total: 3.1 g/dL (ref 1.5–4.5)
Glucose: 96 mg/dL (ref 65–99)
Potassium: 4.5 mmol/L (ref 3.5–5.2)
Sodium: 143 mmol/L (ref 134–144)
Total Protein: 7.1 g/dL (ref 6.0–8.5)

## 2020-06-22 LAB — HEMOGLOBIN A1C
Est. average glucose Bld gHb Est-mCnc: 126 mg/dL
Hgb A1c MFr Bld: 6 % — ABNORMAL HIGH (ref 4.8–5.6)

## 2020-06-22 LAB — FERRITIN: Ferritin: 8 ng/mL — ABNORMAL LOW (ref 15–150)

## 2020-06-22 LAB — TSH: TSH: 2.69 u[IU]/mL (ref 0.450–4.500)

## 2020-06-22 LAB — BETA HCG QUANT (REF LAB): hCG Quant: <1 m[IU]/mL

## 2020-06-26 ENCOUNTER — Telehealth: Payer: Self-pay

## 2020-06-26 DIAGNOSIS — D509 Iron deficiency anemia, unspecified: Secondary | ICD-10-CM

## 2020-06-26 DIAGNOSIS — Z862 Personal history of diseases of the blood and blood-forming organs and certain disorders involving the immune mechanism: Secondary | ICD-10-CM

## 2020-06-26 DIAGNOSIS — R7303 Prediabetes: Secondary | ICD-10-CM

## 2020-06-26 DIAGNOSIS — Z6837 Body mass index (BMI) 37.0-37.9, adult: Secondary | ICD-10-CM

## 2020-06-26 NOTE — Telephone Encounter (Signed)
Pt given results and recommendations per Dr Oscar La. Pt agreeable for referrals to hematology, prediabetic clinic and medical weight loss clinic. Pt verbalized understanding.   Cc: Rosa for referral. Orders placed  Encounter closed.

## 2020-06-26 NOTE — Telephone Encounter (Signed)
-----   Message from Romualdo Bolk, MD sent at 06/22/2020  6:24 PM EDT ----- Please let the patient know that she continues have iron def. anemic and refer her to Hematology. She should be taking a daily OTC iron tablet, but I think she would benefit from an iron transfusion. Her LDL (bad cholesterol) and her cholesterol ratio are both elevated. This increases her risk of heart disease in the future.  She is still prediabetic, offer her referral to the prediabetic clinic.   If she were able to loose weight, that would help correct her glucose and her lipids. Offer her an appointment to the medical weight loss clinic.  The rest of her blood work is fine.

## 2020-06-27 ENCOUNTER — Telehealth: Payer: Self-pay | Admitting: Oncology

## 2020-06-27 NOTE — Telephone Encounter (Signed)
Referrals placed.  Cc: Rosa for referral Encounter closed.

## 2020-06-27 NOTE — Telephone Encounter (Signed)
Received a new hem referral from Dr. Oscar La for IDA. Ms. Manera has been cld and scheduled to see Dr. Darnelle Catalan on 9/13 at 4pm w/labs at 330pm. Pt aware to arrive 15 minutes early.

## 2020-07-30 NOTE — Progress Notes (Signed)
[facility]  [phone] [phone]     ID: Tiffany Mason DOB: 1996-02-08  MR#: [id number]  JPE#:[id number]  Patient Care Team: System, Pcp Not In as PCP - General Romualdo Bolk, MD as Consulting Physician (Obstetrics and Gynecology) Elfreda Blanchet, Valentino Hue, MD as Consulting Physician (Hematology and Oncology) Lowella Dell, MD OTHER MD:  CHIEF COMPLAINT: iron deficiency anemia  CURRENT TREATMENT:    HISTORY OF CURRENT ILLNESS: Tiffany Mason presented for routine gynecological wellness exam in 06/2020. She has been followed by Dr. Oscar La for PCOS, as well as heavy and irregular periods.   We have hemoglobin levels dating back to August 2019 and they are all consistently between 9.0 and 10.0.  The patient's subsequent history is as detailed below. Results for Tiffany Mason, Tiffany Mason (MRN [id number]) as of 07/30/2020 10:17  Ref. Range 06/24/2018 08:30 06/16/2019 09:40 08/06/2019 14:30 08/30/2019 09:48 06/21/2020 09:23  Hemoglobin Latest Ref Range: 11.1 - 15.9 g/dL 9.2 (L) 9.7 (L) 9.0 (L) 9.3 (L) 9.8 (L)   The patient's MCV also has been low, consistent with either iron deficiency or thalassemia. Results for Tiffany Mason, Tiffany Mason (MRN [id number]) as of 07/30/2020 10:17  Ref. Range 06/24/2018 08:30 06/16/2019 09:40 08/06/2019 14:30 08/30/2019 09:48 06/21/2020 09:23  MCV Latest Ref Range: 79 - 97 fL 71.4 (L) 72 (L) 72 (L) 70.6 (L) 70 (L)   Her ferritin however has been consistently low Results for Tiffany Mason, Tiffany Mason (MRN [id number]) as of 07/30/2020 10:17  Ref. Range 06/04/2018 10:29 06/23/2018 15:51 06/16/2019 09:40 08/06/2019 14:30 06/21/2020 09:23  Ferritin Latest Ref Range: 15.0 - 150.0 ng/mL 8 (L) 7 (L) 13 (L) 10 (L) 8 (L)   She is referred by Dr. Oscar La for consideration of iron replacement and further evaluation of possible hemoglobinopathy.  INTERVAL HISTORY: Tiffany Mason was scheduled in the hematology clinic on 07/31/2020, however she failed to show   REVIEW OF SYSTEMS:   PAST  MEDICAL HISTORY: Past Medical History:  Diagnosis Date  . Abnormal uterine bleeding   . Hirsutism   . History of anemia   . Hormone disorder   . PCOS (polycystic ovarian syndrome)   . Prediabetes     PAST SURGICAL HISTORY: No past surgical history on file.   FAMILY HISTORY: Family History  Problem Relation Age of Onset  . Diabetes Father       GYNECOLOGIC HISTORY:    SOCIAL HISTORY: (updated 07/2020)    HEALTH MAINTENANCE: Social History   Tobacco Use  . Smoking status: Never Smoker  . Smokeless tobacco: Never Used  Vaping Use  . Vaping Use: Never used  Substance Use Topics  . Alcohol use: Not Currently  . Drug use: Not Currently     Colonoscopy: n/a (age)  PAP: 05/2019, negative  Bone density: n/a (age)   Allergies  Allergen Reactions  . Fish Allergy Anaphylaxis    Current Outpatient Medications  Medication Sig Dispense Refill  . drospirenone-ethinyl estradiol (YAZ) 3-0.02 MG tablet Take 1 tablet by mouth daily. 84 tablet 0  . ibuprofen (ADVIL) 200 MG tablet Take 400 mg by mouth every 6 (six) hours as needed for mild pain.    Marland Kitchen nystatin cream (MYCOSTATIN) Apply 1 application topically 2 (two) times daily. Apply to affected area BID for up to 7 days. 30 g 0  . valACYclovir (VALTREX) 500 MG tablet TAKE 1 TABLET BY MOUTH EVERY DAY 90 tablet 0   No current facility-administered medications for this visit.    OBJECTIVE:   There were no vitals filed  for this visit.   There is no height or weight on file to calculate BMI.   Wt Readings from Last 3 Encounters:  06/21/20 236 lb (107 kg)  06/16/19 248 lb 12.8 oz (112.9 kg)  06/23/18 229 lb (103.9 kg)      LAB RESULTS:  CMP     Component Value Date/Time   NA 143 06/21/2020 0923   K 4.5 06/21/2020 0923   CL 107 (H) 06/21/2020 0923   CO2 23 06/21/2020 0923   GLUCOSE 96 06/21/2020 0923   GLUCOSE 110 (H) 08/30/2019 1012   BUN 10 06/21/2020 0923   CREATININE 0.70 06/21/2020 0923   CALCIUM 9.0  06/21/2020 0923   PROT 7.1 06/21/2020 0923   ALBUMIN 4.0 06/21/2020 0923   AST 14 06/21/2020 0923   ALT 16 06/21/2020 0923   ALKPHOS 74 06/21/2020 0923   BILITOT <0.2 06/21/2020 0923   GFRNONAA 121 06/21/2020 0923   GFRAA 139 06/21/2020 0923    No results found for: TOTALPROTELP, ALBUMINELP, A1GS, A2GS, BETS, BETA2SER, GAMS, MSPIKE, SPEI  Lab Results  Component Value Date   WBC 5.9 06/21/2020   HGB 9.8 (L) 06/21/2020   HCT 33.5 (L) 06/21/2020   MCV 70 (L) 06/21/2020   PLT 471 (H) 06/21/2020    No results found for: LABCA2  No components found for: QMVHQI696  No results for input(s): INR in the last 168 hours.  No results found for: LABCA2  No results found for: EXB284  No results found for: XLK440  No results found for: NUU725  No results found for: CA2729  No components found for: HGQUANT  No results found for: CEA1 / No results found for: CEA1   No results found for: AFPTUMOR  No results found for: CHROMOGRNA  No results found for: KPAFRELGTCHN, LAMBDASER, KAPLAMBRATIO (kappa/lambda light chains)  No results found for: HGBA, HGBA2QUANT, HGBFQUANT, HGBSQUAN (Hemoglobinopathy evaluation)   No results found for: LDH  No results found for: IRON, TIBC, IRONPCTSAT (Iron and TIBC)  Lab Results  Component Value Date   FERRITIN 8 (L) 06/21/2020    Urinalysis    Component Value Date/Time   COLORURINE RED (A) 08/30/2019 1113   APPEARANCEUR TURBID (A) 08/30/2019 1113   LABSPEC  08/30/2019 1113    TEST NOT REPORTED DUE TO COLOR INTERFERENCE OF URINE PIGMENT   PHURINE  08/30/2019 1113    TEST NOT REPORTED DUE TO COLOR INTERFERENCE OF URINE PIGMENT   GLUCOSEU (A) 08/30/2019 1113    TEST NOT REPORTED DUE TO COLOR INTERFERENCE OF URINE PIGMENT   HGBUR (A) 08/30/2019 1113    TEST NOT REPORTED DUE TO COLOR INTERFERENCE OF URINE PIGMENT   BILIRUBINUR (A) 08/30/2019 1113    TEST NOT REPORTED DUE TO COLOR INTERFERENCE OF URINE PIGMENT   KETONESUR (A)  08/30/2019 1113    TEST NOT REPORTED DUE TO COLOR INTERFERENCE OF URINE PIGMENT   PROTEINUR (A) 08/30/2019 1113    TEST NOT REPORTED DUE TO COLOR INTERFERENCE OF URINE PIGMENT   NITRITE (A) 08/30/2019 1113    TEST NOT REPORTED DUE TO COLOR INTERFERENCE OF URINE PIGMENT   LEUKOCYTESUR (A) 08/30/2019 1113    TEST NOT REPORTED DUE TO COLOR INTERFERENCE OF URINE PIGMENT    STUDIES: No results found.   ELIGIBLE FOR AVAILABLE RESEARCH PROTOCOL:  ASSESSMENT: 24 y.o. [place] woman with a history of menometrorrhagia, and persistent iron deficiency, not responding to oral iron  PLAN: The patient did not show for her appointment on 07/31/2020.  Follow-up  letter has been sent   Tiffany Gurney C. Blondie Riggsbee, MD 07/31/2020 6:21 PM Medical Oncology and Hematology [facility] [address] [place], [place] [address] Tel. [phone]    Fax. [phone]   This document serves as a record of services personally performed by Ruthann Cancer, MD. It was created on his behalf by Mickie Bail, a trained medical scribe. The creation of this record is based on the scribe's personal observations and the provider's statements to them.   I, Ruthann Cancer MD, have reviewed the above documentation for accuracy and completeness, and I agree with the above.    *Total Encounter Time as defined by the Centers for Medicare and Medicaid Services includes, in addition to the face-to-face time of a patient visit (documented in the note above) non-face-to-face time: obtaining and reviewing outside history, ordering and reviewing medications, tests or procedures, care coordination (communications with other health care professionals or caregivers) and documentation in the medical record.

## 2020-07-31 ENCOUNTER — Inpatient Hospital Stay: Payer: BC Managed Care – PPO | Attending: Oncology | Admitting: Oncology

## 2020-07-31 ENCOUNTER — Inpatient Hospital Stay: Payer: BC Managed Care – PPO

## 2020-07-31 DIAGNOSIS — D649 Anemia, unspecified: Secondary | ICD-10-CM

## 2020-09-05 ENCOUNTER — Telehealth: Payer: Self-pay

## 2020-09-05 MED ORDER — MEDROXYPROGESTERONE ACETATE 10 MG PO TABS: ORAL_TABLET | ORAL | 0 refills | Status: DC

## 2020-09-05 NOTE — Telephone Encounter (Signed)
New message  Upcoming appt on 11.1.2021  The patient C/o bleeding x 19 days.   No emergency room / No urgent care visit .   Please advise

## 2020-09-05 NOTE — Telephone Encounter (Signed)
Spoke with patient, advised per Dr. Oscar La.  Rx for Provera to verified pharmacy.  Patient read back UPT and medication instructions.  Patient verbalizes understanding and is agreeable.  Encounter closed.

## 2020-09-05 NOTE — Telephone Encounter (Signed)
She has a h/o anovulatory bleeding and it sounds like that is what is going on again. I would recommend she check a UPT, as long as it is negative I would start her on provera 10 mg BID until bleeding stops, then continue for a full 10 days. This will hopefully stop her bleeding and then empty her cavity. We can further discuss a plan at her visit later this week. If she is light headed or dizzy she needs to go to the ER.

## 2020-09-05 NOTE — Telephone Encounter (Signed)
Spoke with patient. Patient was seen in the office on 06/21/20 for AEX, Rx sent for Yaz, patient never started. Reports spotting the whole month of September, full menses started on 9/30. Patient reports she is changing a full overnight pad q2 hours with palm size clots. Reports fatigue. Hx of anemia. No longer taking oral iron. SA, uses condoms. Is scheduled for 3 mo f/u on 11/1.   Advised OV needed now for further evaluation, patient declines OV today or tomorrow due to her work schedule. OV scheduled for 10/21 at 8am with Dr. Oscar La. ER precautions reviewed for new or worsening symptoms. Advised if any chance of pregnancy, take UPT to r/o pregnancy. Advised patient I will update Dr. Oscar La and f/u with any additional recommendations. Patient verbalizes understanding and is agreeable.   Routing to Dr. Oscar La for final review.

## 2020-09-07 ENCOUNTER — Telehealth: Payer: Self-pay

## 2020-09-07 ENCOUNTER — Encounter: Payer: Self-pay | Admitting: Obstetrics and Gynecology

## 2020-09-07 ENCOUNTER — Ambulatory Visit: Payer: BC Managed Care – PPO | Admitting: Obstetrics and Gynecology

## 2020-09-07 NOTE — Telephone Encounter (Signed)
Call to patient, no answer, mailbox not set up, unable to leave message.   MyChart not active.

## 2020-09-07 NOTE — Telephone Encounter (Signed)
Patient did not keep appointment for today.  °

## 2020-09-07 NOTE — Telephone Encounter (Signed)
Spoke with patient. Called for update and to r/s OV.  Patient reports bleeding has slowed down, changing overnight pad q3-4 hours. She did not start Provera, has not picked up Rx. Reports UPT neg on 09/05/20. Advised to start Provera as prescribed, reviewed med instructions again. Patient reports fatigue, denies any other symptoms. OV offered for 10/26, patient declined, states she will have to return call to office at a later date to reschedule. Advised patient I will update provider and return call if any additional recommendations, patient agreeable.   Routing to provider for final review. Patient is agreeable to disposition. Will close encounter.

## 2020-09-07 NOTE — Telephone Encounter (Signed)
Patient returned call

## 2020-09-07 NOTE — Telephone Encounter (Signed)
Patient returned call and scheduled OV for 10/09/20 at 4:30pm. Patient declined earlier appt due to her work schedule. She will call if any new symptoms develop or concerns.

## 2020-09-16 ENCOUNTER — Other Ambulatory Visit: Payer: Self-pay | Admitting: Obstetrics and Gynecology

## 2020-09-18 ENCOUNTER — Ambulatory Visit: Payer: BC Managed Care – PPO | Admitting: Obstetrics and Gynecology

## 2020-09-18 NOTE — Telephone Encounter (Signed)
Medication refill request: Valtrex  Last AEX:  06-21-20 JJ  Next OV: 10-09-20 Last MMG (if hormonal medication request): n/a Refill authorized: Today, please advise.   Medication pended for #30, 0RF. Please refill if appropriate.

## 2020-10-09 ENCOUNTER — Other Ambulatory Visit: Payer: Self-pay

## 2020-10-09 ENCOUNTER — Ambulatory Visit (INDEPENDENT_AMBULATORY_CARE_PROVIDER_SITE_OTHER): Payer: BC Managed Care – PPO | Admitting: Obstetrics and Gynecology

## 2020-10-09 ENCOUNTER — Encounter: Payer: Self-pay | Admitting: Obstetrics and Gynecology

## 2020-10-09 VITALS — BP 130/74 | HR 77 | Ht 66.0 in | Wt 240.0 lb

## 2020-10-09 DIAGNOSIS — N921 Excessive and frequent menstruation with irregular cycle: Secondary | ICD-10-CM | POA: Diagnosis not present

## 2020-10-09 DIAGNOSIS — E282 Polycystic ovarian syndrome: Secondary | ICD-10-CM

## 2020-10-09 LAB — POCT URINE PREGNANCY: Preg Test, Ur: NEGATIVE

## 2020-10-09 MED ORDER — MEDROXYPROGESTERONE ACETATE 5 MG PO TABS: 5.0000 mg | ORAL_TABLET | Freq: Every day | ORAL | 0 refills | Status: DC

## 2020-10-09 MED ORDER — NORETHIN ACE-ETH ESTRAD-FE 1-20 MG-MCG PO TABS: 1.0000 | ORAL_TABLET | Freq: Every day | ORAL | 2 refills | Status: DC

## 2020-10-09 NOTE — Patient Instructions (Signed)

## 2020-10-09 NOTE — Progress Notes (Signed)
GYNECOLOGY  VISIT   HPI: 24 y.o.   Single Black or African American Not Hispanic or Latino  female   [id number] with Patient's last menstrual period was 09/25/2020.   here for irregular bleeding. She states that she has her period and then she keeps spotting after.  H/O PCOS and anovulation, she has had a negative w/u.    In 8/21 she was given a script for yaz, was out of network so she didn't take it.  She went for weeks with a heavy cycle at the end of October. Spotting the whole month of November.  Sexually active, always uses condoms.   GYNECOLOGIC HISTORY: Patient's last menstrual period was 09/25/2020. Contraception: condoms Menopausal hormone therapy: none         OB History    Gravida  0   Para  0   Term  0   Preterm  0   AB  0   Living  0     SAB  0   TAB  0   Ectopic  0   Multiple  0   Live Births  0              Patient Active Problem List   Diagnosis Date Noted  . Prediabetes   . PCOS (polycystic ovarian syndrome)   . Anemia 06/24/2018  . Menometrorrhagia 06/23/2018  . Severe anemia 06/23/2018  . Polycystic ovaries 04/29/2016  . Obesity 04/12/2016  . Amenorrhea 04/11/2016  . Postcoital bleeding 04/11/2016  . Vaginitis 04/11/2016    Past Medical History:  Diagnosis Date  . Abnormal uterine bleeding   . Hirsutism   . History of anemia   . Hormone disorder   . PCOS (polycystic ovarian syndrome)   . Prediabetes     No past surgical history on file.  Current Outpatient Medications  Medication Sig Dispense Refill  . ibuprofen (ADVIL) 200 MG tablet Take 400 mg by mouth every 6 (six) hours as needed for mild pain.    Marland Kitchen nystatin cream (MYCOSTATIN) Apply 1 application topically 2 (two) times daily. Apply to affected area BID for up to 7 days. 30 g 0  . valACYclovir (VALTREX) 500 MG tablet TAKE 1 TABLET BY MOUTH EVERY DAY 30 tablet 0   No current facility-administered medications for this visit.     ALLERGIES: Fish allergy  Family  History  Problem Relation Age of Onset  . Diabetes Father     Social History   Socioeconomic History  . Marital status: Single    Spouse name: Not on file  . Number of children: Not on file  . Years of education: Not on file  . Highest education level: Not on file  Occupational History  . Not on file  Tobacco Use  . Smoking status: Never Smoker  . Smokeless tobacco: Never Used  Vaping Use  . Vaping Use: Never used  Substance and Sexual Activity  . Alcohol use: Not Currently  . Drug use: Not Currently  . Sexual activity: Yes    Birth control/protection: Condom  Other Topics Concern  . Not on file  Social History Narrative  . Not on file   Social Determinants of Health   Financial Resource Strain:   . Difficulty of Paying Living Expenses: Not on file  Food Insecurity:   . Worried About Programme researcher, broadcasting/film/video in the Last Year: Not on file  . Ran Out of Food in the Last Year: Not on file  Transportation Needs:   .  Lack of Transportation (Medical): Not on file  . Lack of Transportation (Non-Medical): Not on file  Physical Activity:   . Days of Exercise per Week: Not on file  . Minutes of Exercise per Session: Not on file  Stress:   . Feeling of Stress : Not on file  Social Connections:   . Frequency of Communication with Friends and Family: Not on file  . Frequency of Social Gatherings with Friends and Family: Not on file  . Attends Religious Services: Not on file  . Active Member of Clubs or Organizations: Not on file  . Attends Banker Meetings: Not on file  . Marital Status: Not on file  Intimate Partner Violence:   . Fear of Current or Ex-Partner: Not on file  . Emotionally Abused: Not on file  . Physically Abused: Not on file  . Sexually Abused: Not on file    Review of Systems  All other systems reviewed and are negative.   PHYSICAL EXAMINATION:    BP 130/74   Pulse 77   Ht 5\' 6"  (1.676 m)   Wt 240 lb (108.9 kg)   LMP 09/25/2020   SpO2  98%   BMI 38.74 kg/m     General appearance: alert, cooperative and appears stated age   ASSESSMENT Menometrorrhagia, prior negative w/u. Didn't start pills in 8/21 because they were out of network.     PLAN UPT negative Provera 5 mg x 5 days Start OCP's on the first day of her cycle. No contraindications, risks reviewed

## 2020-10-16 ENCOUNTER — Other Ambulatory Visit: Payer: Self-pay | Admitting: Obstetrics and Gynecology

## 2020-10-16 DIAGNOSIS — B372 Candidiasis of skin and nail: Secondary | ICD-10-CM

## 2020-10-16 NOTE — Telephone Encounter (Signed)
Medication refill request: Valacyclovir 500mg   Last AEX:  06/21/20 Next AEX: not scheduled  Last MMG (if hormonal medication request): NA Refill authorized: 30/0

## 2020-10-19 ENCOUNTER — Other Ambulatory Visit: Payer: Self-pay

## 2020-10-19 ENCOUNTER — Other Ambulatory Visit: Payer: Self-pay | Admitting: Obstetrics and Gynecology

## 2020-10-19 DIAGNOSIS — N921 Excessive and frequent menstruation with irregular cycle: Secondary | ICD-10-CM
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
## 2020-10-20 MED ORDER — MEDROXYPROGESTERONE ACETATE 5 MG PO TABS: 5.0000 mg | ORAL_TABLET | Freq: Every day | ORAL | 0 refills | Status: DC

## 2020-11-04 ENCOUNTER — Other Ambulatory Visit: Payer: Self-pay

## 2020-11-04 ENCOUNTER — Ambulatory Visit
Admission: EM | Admit: 2020-11-04 | Discharge: 2020-11-04 | Disposition: A | Discharge status: Home / Self Care | Payer: BC Managed Care – PPO | Attending: Urgent Care | Admitting: Urgent Care

## 2020-11-04 DIAGNOSIS — R519 Headache, unspecified: Secondary | ICD-10-CM

## 2020-11-04 DIAGNOSIS — Z9289 Personal history of other medical treatment: Secondary | ICD-10-CM

## 2020-11-04 DIAGNOSIS — N921 Excessive and frequent menstruation with irregular cycle: Secondary | ICD-10-CM | POA: Diagnosis not present

## 2020-11-04 DIAGNOSIS — Z862 Personal history of diseases of the blood and blood-forming organs and certain disorders involving the immune mechanism: Secondary | ICD-10-CM

## 2020-11-04 DIAGNOSIS — E282 Polycystic ovarian syndrome: Secondary | ICD-10-CM

## 2020-11-04 MED ORDER — MEGESTROL ACETATE 40 MG PO TABS: 80.0000 mg | ORAL_TABLET | Freq: Every day | ORAL | 0 refills | Status: DC

## 2020-11-04 NOTE — ED Provider Notes (Signed)
[facility]   MRN: [id number] DOB: 1996/07/16  Subjective:   Tiffany Mason is a 24 y.o. female presenting for 3-week history of persistent heavy vaginal bleeding.  Patient has a history of PCOS and has a history of menometrorrhagia.  In 2019 she became severe, had to have a blood transfusion.  She has not had a recurrence of this.  She has contacted her gynecologist and she was given Provera which did not help the patient.  She finished this last week.  Denies fever, shortness of breath, chest pain but has had some intermittent menstrual cramps.  No current facility-administered medications for this encounter.  Current Outpatient Medications:  .  ibuprofen (ADVIL) 200 MG tablet, Take 400 mg by mouth every 6 (six) hours as needed for mild pain., Disp: , Rfl:  .  medroxyPROGESTERone (PROVERA) 5 MG tablet, Take 1 tablet (5 mg total) by mouth daily., Disp: 5 tablet, Rfl: 0 .  valACYclovir (VALTREX) 500 MG tablet, Take one tablet daily, increase to one tablet bid x 3 days as needed., Disp: 90 tablet, Rfl: 2 .  norethindrone-ethinyl estradiol (LOESTRIN FE) 1-20 MG-MCG tablet, Take 1 tablet by mouth daily., Disp: 84 tablet, Rfl: 2 .  nystatin cream (MYCOSTATIN), Apply 1 application topically 2 (two) times daily. Apply to affected area BID for up to 7 days., Disp: 30 g, Rfl: 0   Allergies  Allergen Reactions  . Fish Allergy Anaphylaxis    Past Medical History:  Diagnosis Date  . Abnormal uterine bleeding   . Hirsutism   . History of anemia   . Hormone disorder   . PCOS (polycystic ovarian syndrome)   . Prediabetes      History reviewed. No pertinent surgical history.  Family History  Problem Relation Age of Onset  . Diabetes Father     Social History   Tobacco Use  . Smoking status: Never Smoker  . Smokeless tobacco: Never Used  Vaping Use  . Vaping Use: Never used  Substance Use Topics  . Alcohol use: Not Currently  . Drug use: Not Currently     ROS   Objective:   Vitals: BP 121/84 (BP Location: Left Arm)   Pulse 84   Temp 98.6 F (37 C) (Oral)   Resp 20   SpO2 97%   Physical Exam Constitutional:      General: She is not in acute distress.    Appearance: Normal appearance. She is well-developed and normal weight. She is not ill-appearing, toxic-appearing or diaphoretic.  HENT:     Head: Normocephalic and atraumatic.     Right Ear: External ear normal.     Left Ear: External ear normal.     Nose: Nose normal.     Mouth/Throat:     Mouth: Mucous membranes are moist.     Pharynx: Oropharynx is clear.  Eyes:     General: No scleral icterus.    Extraocular Movements: Extraocular movements intact.     Pupils: Pupils are equal, round, and reactive to light.  Cardiovascular:     Rate and Rhythm: Normal rate and regular rhythm.     Pulses: Normal pulses.     Heart sounds: Normal heart sounds. No murmur heard. No friction rub. No gallop.   Pulmonary:     Effort: Pulmonary effort is normal. No respiratory distress.     Breath sounds: Normal breath sounds. No stridor. No wheezing, rhonchi or rales.  Abdominal:     General: Bowel sounds are normal. There is no  distension.     Palpations: Abdomen is soft. There is no mass.     Tenderness: There is no abdominal tenderness. There is no right CVA tenderness, left CVA tenderness, guarding or rebound.  Skin:    General: Skin is warm and dry.     Coloration: Skin is pale. Skin is not jaundiced.     Findings: No bruising, erythema or rash.     Comments: Slight pallor of her skin.  Neurological:     General: No focal deficit present.     Mental Status: She is alert and oriented to person, place, and time.  Psychiatric:        Mood and Affect: Mood normal.        Behavior: Behavior normal.        Thought Content: Thought content normal.        Judgment: Judgment normal.      Assessment and Plan :   PDMP not reviewed this encounter.  1. Metrorrhagia   2. PCOS  (polycystic ovarian syndrome)   3. History of transfusion   4. History of anemia   5. Generalized headaches     Discussed risks of severe anemia or transfusion.  Patient is not interested in going to the emergency room.  I offered her Megace as an intervention.  Emphasized need for follow-up with her gynecologist.  Maintain strict ER precautions. Counseled patient on potential for adverse effects with medications prescribed today, patient verbalized understanding.    Wallis Bamberg, PA-C 11/04/20 1656

## 2020-11-04 NOTE — ED Triage Notes (Signed)
Patient states she has been having a period since thanksgiving. Pt states she bleeds heavily and has not stopped. Pt went to her OB doctor and she was put on provera but it made the bleeding worse. Pt is aox4 and ambulatory.

## 2020-11-06 ENCOUNTER — Telehealth: Payer: Self-pay

## 2020-11-06 NOTE — Telephone Encounter (Signed)
Patient is calling in regards to taking provera and symptoms are becoming worse.

## 2020-11-06 NOTE — Telephone Encounter (Signed)
Call to patient. Patient calling stating that she has been on her period since October. Cycle got heavy around Thanksgiving and is now heavy again. Sexually active using condoms for contraception. Patient states that she finished the last dose of provera prescribed by Dr. Oscar La on 10-29-20. Was seen at [facility] on 11-04-20 and labs were unable to be done, but MD told her she looked pale. Was prescribed Megace, but has not started/ refused prescription. Patient states that she is "soaking through everything" and when she stands or does any activity, her period is "running out like water." States she is having to go sit on the toilet every 15 minutes to let it "run out." Denies fever/chills. States she does feel nauseous, weak and has a headache. Also complaining of cramping that she rates 7/10. Taking Ibuprofen that "helps for the time being." Patient is currently at home in [place], [place] and will be there for the next couple of weeks. Patient advised to seek urgent medical care. States she has someone that can drive her due to feeling weak. RN advised to call with update after being seen at the ER. Patient agreeable.   Routing to provider.

## 2020-11-08 NOTE — Telephone Encounter (Signed)
I would strongly urge her to stay on the OCP's. Taking the pills they was it was recommended should stop her bleeding. She should then continue on the pills and f/u here in 2 months for an ultrasound at the beginning of a pill pack (after a cycle)

## 2020-11-08 NOTE — Telephone Encounter (Signed)
Please call and check on the patient.

## 2020-11-08 NOTE — Telephone Encounter (Signed)
Spoke with pt. Pt states did go to ER at [facility] in [place] on 11/06/20. Pt states received 1 unit of blood and feeling much better. Pt denies feeling weak, dizzy or lightheaded. Denies any heavy bleeding. Pt only changing pad every 2-3 hours. Denies clots or soaking at this time.  Pt states was advised to take birth control pills as follows: 12/21 5 pills 12/22 4 pills  12/23 3 pills  12/24 2 pills  12/25 1 pill and then skip placebos since the heavy bleeding and then start new pack.  Pt was advised to have follow up visit with Dr Oscar La, but were unable to find appt today with work schedule. Advised to call back at her earliest convenience for an appt. Pt agreeable and verbalized understanding.   Routing to Dr Oscar La for update and review. Please advise with any further recommendations.

## 2020-11-08 NOTE — Telephone Encounter (Signed)
Attempted to return call to pt. No answer and voicemail not set up. Not mychart active.

## 2020-11-21 NOTE — Telephone Encounter (Signed)
Spoke with patient.  Patient states she is at work, she will return call today during her break.

## 2020-12-19 NOTE — Telephone Encounter (Signed)
Call placed to patients number on file x3, automated message says "you call did not go through". No alternative number on file. No alternative contact on dpr. MyChart not active.   Dr. Oscar La -please review and advise.

## 2020-12-20 NOTE — Telephone Encounter (Signed)
Please send her a letter that we have been trying to reach her and to call us to check in.

## 2021-01-01 ENCOUNTER — Encounter: Payer: Self-pay | Admitting: *Deleted

## 2021-01-01 NOTE — Telephone Encounter (Signed)
Letter pended and copy to Dr. Oscar La for review.

## 2021-01-04 NOTE — Telephone Encounter (Signed)
Letter reviewed and signed by Dr. Jertson.  Letter mailed to address on file.   Encounter closed. 

## 2021-01-25 ENCOUNTER — Telehealth: Payer: Self-pay

## 2021-01-25 DIAGNOSIS — B372 Candidiasis of skin and nail: Secondary | ICD-10-CM

## 2021-01-25 MED ORDER — VALACYCLOVIR HCL 500 MG PO TABS: ORAL_TABLET | ORAL | 1 refills | Status: DC

## 2021-01-25 MED ORDER — NORETHIN ACE-ETH ESTRAD-FE 1-20 MG-MCG PO TABS: 1.0000 | ORAL_TABLET | Freq: Every day | ORAL | 1 refills | Status: DC

## 2021-01-25 NOTE — Telephone Encounter (Signed)
Insurance has changed and [facility] no longer in network.  Patient needs Rx for Valtrex and BCP resent to [facility].  Rx's sent.

## 2021-02-10 ENCOUNTER — Emergency Department (HOSPITAL_COMMUNITY)
Admission: EM | Admit: 2021-02-10 | Discharge: 2021-02-10 | Disposition: A | Discharge status: Home / Self Care | Payer: 59 | Attending: Emergency Medicine | Admitting: Emergency Medicine

## 2021-02-10 ENCOUNTER — Encounter (HOSPITAL_COMMUNITY): Payer: Self-pay

## 2021-02-10 ENCOUNTER — Emergency Department (HOSPITAL_COMMUNITY): Payer: 59

## 2021-02-10 DIAGNOSIS — S0993XA Unspecified injury of face, initial encounter: Secondary | ICD-10-CM | POA: Diagnosis not present

## 2021-02-10 DIAGNOSIS — S0083XA Contusion of other part of head, initial encounter: Secondary | ICD-10-CM | POA: Diagnosis not present

## 2021-02-10 MED ORDER — ACETAMINOPHEN 325 MG PO TABS
650.0000 mg | ORAL_TABLET | Freq: Once | ORAL | Status: AC
  Administered 2021-02-10: 650 mg via ORAL
  Filled 2021-02-10: qty 2

## 2021-02-10 MED ORDER — NAPROXEN 375 MG PO TABS: 375.0000 mg | ORAL_TABLET | Freq: Two times a day (BID) | ORAL | 0 refills | Status: AC
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
## 2021-02-10 NOTE — Discharge Instructions (Addendum)
Get help right away if: You have severe pain or a headache that is not relieved by medicine. You have unusual sleepiness, confusion, or personality changes. You vomit. You have a nosebleed that does not stop. You have double vision or blurred vision. You have a continuous clear fluid draining from your nose or ear. You have trouble walking or using your arms or legs. You have severe dizziness. 

## 2021-04-11 ENCOUNTER — Other Ambulatory Visit: Payer: Self-pay | Admitting: Obstetrics and Gynecology

## 2021-05-16 ENCOUNTER — Other Ambulatory Visit: Payer: Self-pay

## 2021-05-16 NOTE — Telephone Encounter (Signed)
Patient called because the last refill she picked up on her bcp's she was told not additional refills.  Advised AEX due in August. Last AEX 06/21/20.  I will have appt desk call her and arrange AEX and get okay from Dr. Oscar La to refill once more .

## 2021-05-17 NOTE — Telephone Encounter (Addendum)
Claudia scheduled patient for 09/27/21 at 8 am as patient required 8am appointment and that was first available.   Last AEX 06/21/20.  Needs OC refills.

## 2021-05-22 MED ORDER — NORETHIN ACE-ETH ESTRAD-FE 1-20 MG-MCG PO TABS: 1.0000 | ORAL_TABLET | Freq: Every day | ORAL | 1 refills | Status: DC

## 2021-09-24 NOTE — Progress Notes (Deleted)
25 y.o. G0P0000 Single Black or African American Not Hispanic or Latino female here for annual exam.      No LMP recorded.          Sexually active: {yes no:314532}  The current method of family planning is OCP (estrogen/progesterone).    Exercising: {yes no:314532}  {types:19826} Smoker:  {YES [id number]  Health Maintenance: Pap:  06/04/18- WNL  History of abnormal Pap:  no MMG:  [place] 05/05/17- birads 1  BMD:   n/a Colonoscopy: n/a TDaP:  UTD per pt Gardasil: Complete per pt    reports that she has never smoked. She has never used smokeless tobacco. She reports that she does not currently use alcohol. She reports that she does not currently use drugs.  Past Medical History:  Diagnosis Date   Abnormal uterine bleeding    Hirsutism    History of anemia    Hormone disorder    PCOS (polycystic ovarian syndrome)    Prediabetes     No past surgical history on file.  Current Outpatient Medications  Medication Sig Dispense Refill   ibuprofen (ADVIL) 200 MG tablet Take 400 mg by mouth every 6 (six) hours as needed for mild pain.     medroxyPROGESTERone (PROVERA) 5 MG tablet Take 1 tablet (5 mg total) by mouth daily. 5 tablet 0   megestrol (MEGACE) 40 MG tablet Take 2 tablets (80 mg total) by mouth daily. 10 tablet 0   naproxen (NAPROSYN) 375 MG tablet Take 1 tablet (375 mg total) by mouth 2 (two) times daily with a meal. 20 tablet 0   norethindrone-ethinyl estradiol-FE (LOESTRIN FE) 1-20 MG-MCG tablet Take 1 tablet by mouth daily. 84 tablet 1   nystatin cream (MYCOSTATIN) Apply 1 application topically 2 (two) times daily. Apply to affected area BID for up to 7 days. 30 g 0   valACYclovir (VALTREX) 500 MG tablet Take one tablet daily, increase to one tablet bid x 3 days as needed. 90 tablet 1   No current facility-administered medications for this visit.    Family History  Problem Relation Age of Onset   Diabetes Father     Review of Systems  Exam:   There were no vitals taken  for this visit.  Weight change: @WEIGHTCHANGE @ Height:      Ht Readings from Last 3 Encounters:  10/09/20 5\' 6"  (1.676 m)  06/21/20 5' 6.25" (1.683 m)  06/16/19 5\' 6"  (1.676 m)    General appearance: alert, cooperative and appears stated age Head: Normocephalic, without obvious abnormality, atraumatic Neck: no adenopathy, supple, symmetrical, trachea midline and thyroid {CHL AMB PHY EX THYROID NORM [phone]::"normal to inspection and palpation"} Lungs: clear to auscultation bilaterally Cardiovascular: regular rate and rhythm Breasts: {Exam; breast:13139::"normal appearance, no masses or tenderness"} Abdomen: soft, non-tender; non distended,  no masses,  no organomegaly Extremities: extremities normal, atraumatic, no cyanosis or edema Skin: Skin color, texture, turgor normal. No rashes or lesions Lymph nodes: Cervical, supraclavicular, and axillary nodes normal. No abnormal inguinal nodes palpated Neurologic: Grossly normal   Pelvic: External genitalia:  no lesions              Urethra:  normal appearing urethra with no masses, tenderness or lesions              Bartholins and Skenes: normal                 Vagina: normal appearing vagina with normal color and discharge, no lesions  Cervix: {CHL AMB PHY EX CERVIX NORM DEFAULT:[phone]::"no lesions"}               Bimanual Exam:  Uterus:  {CHL AMB PHY EX UTERUS NORM [phone]::"normal size, contour, position, consistency, mobility, non-tender"}              Adnexa: {CHL AMB PHY EX ADNEXA NO MASS DEFAULT:[phone]::"no mass, fullness, tenderness"}               Rectovaginal: Confirms               Anus:  normal sphincter tone, no lesions  *** chaperoned for the exam.  A:  Well Woman with normal exam  P:

## 2021-09-27 ENCOUNTER — Ambulatory Visit: Payer: Self-pay | Admitting: Obstetrics and Gynecology

## 2021-09-27 DIAGNOSIS — Z0289 Encounter for other administrative examinations: Secondary | ICD-10-CM

## 2021-12-28 ENCOUNTER — Other Ambulatory Visit: Payer: Self-pay | Admitting: *Deleted

## 2021-12-28 NOTE — Telephone Encounter (Signed)
-----   Message from Jerilynn Mages sent at 12/28/2021  9:48 AM EST ----- Left message to call and schedule ----- Message ----- From: Aura Camps, RMA Sent: 12/28/2021   9:43 AM EST To: [facility] Appointments  Patient called needs to schedule her annual exam with JJ. Please call to schedule.

## 2022-01-01 ENCOUNTER — Telehealth: Payer: Self-pay

## 2022-01-01 NOTE — Telephone Encounter (Signed)
Debarah Crape called patient to schedule appt and left message to call. Debarah Crape said that she has called before and they left several messages previously to call.

## 2022-01-01 NOTE — Telephone Encounter (Signed)
Patient called for bcp refill. She is past due for AEX.  Last AEX 06/21/2020.  She would like to schedule AEX so I sent message to the appt desk to please call her and arrange AEX appt and let me know so I can forward her refill request to her provider.

## 2022-01-02 MED ORDER — NORETHIN ACE-ETH ESTRAD-FE 1-20 MG-MCG PO TABS: 1.0000 | ORAL_TABLET | Freq: Every day | ORAL | 1 refills | Status: DC

## 2022-01-02 NOTE — Telephone Encounter (Signed)
Annual exam scheduled on 01/19/22 Last annual exam was 06/2020

## 2022-01-08 NOTE — Progress Notes (Signed)
26 y.o. G0P0000 Single Black or African American Not Hispanic or Latino female here for annual exam.  H/O PCOS and anovulation, she has had a negative w/u.  On OCP's, she takes them continuously. She takes a break from the pill for a week if she has BTB. Bleeding is better on the pill than off the pill. Same partner x 4 years, not living together. No dyspareunia.  ?Period Duration (Days): 7 ?Period Pattern: (!) Irregular ?Menstrual Flow: Heavy ?Menstrual Control: Maxi pad ?Menstrual Control Change Freq (Hours): 2 ?Dysmenorrhea: (!) Moderate ?Dysmenorrhea Symptoms: Cramping ? ?She has been on a diet for a month and has lost 11 lbs. Not exercising yet.  ? ?No HSV outbreaks that she is aware of, she had a + blood test. Has valtrex at home in case she needs it. She will call if she needs a refill.  ? ?Patient's last menstrual period was 12/31/2021.          ?Sexually active: Yes.    ?The current method of family planning is OCP (estrogen/progesterone).    ?Exercising: No.  The patient does not participate in regular exercise at present. ?Smoker:  no ? ?Health Maintenance: ?Pap:  718/2019 WNL ?History of abnormal Pap:  no ?MMG:  none  ?BMD:   none  ?Colonoscopy: none  ?TDaP:  up to date per patient  ?Gardasil: complete per patient  ? ? reports that she has never smoked. She has never used smokeless tobacco. She reports that she does not currently use alcohol. She reports that she does not currently use drugs. Rare ETOH. Works for a [employer]. ? ?Past Medical History:  ?Diagnosis Date  ? Abnormal uterine bleeding   ? Hirsutism   ? History of anemia   ? Hormone disorder   ? PCOS (polycystic ovarian syndrome)   ? Prediabetes   ? ? ?History reviewed. No pertinent surgical history. ? ?Current Outpatient Medications  ?Medication Sig Dispense Refill  ? ibuprofen (ADVIL) 200 MG tablet Take 400 mg by mouth every 6 (six) hours as needed for mild pain.    ? naproxen (NAPROSYN) 375 MG tablet Take 1 tablet (375 mg  total) by mouth 2 (two) times daily with a meal. 20 tablet 0  ? norethindrone-ethinyl estradiol-FE (LOESTRIN FE) 1-20 MG-MCG tablet Take 1 tablet by mouth daily. 28 tablet 1  ? nystatin cream (MYCOSTATIN) Apply 1 application topically 2 (two) times daily. Apply to affected area BID for up to 7 days. 30 g 0  ? valACYclovir (VALTREX) 500 MG tablet Take one tablet daily, increase to one tablet bid x 3 days as needed. 90 tablet 1  ? ?No current facility-administered medications for this visit.  ? ? ?Family History  ?Problem Relation Age of Onset  ? Diabetes Father   ? ? ?Review of Systems  ?All other systems reviewed and are negative. ?C/O hair loss ? ?Exam:   ?BP 110/78   Pulse 78   Ht 5\' 6"  (1.676 m)   Wt 229 lb (103.9 kg)   LMP 12/31/2021   SpO2 98%   BMI 36.96 kg/m?   Weight change: @WEIGHTCHANGE @ Height:   Height: 5\' 6"  (167.6 cm)  ?Ht Readings from Last 3 Encounters:  ?01/17/22 5\' 6"  (1.676 m)  ?10/09/20 5\' 6"  (1.676 m)  ?06/21/20 5' 6.25" (1.683 m)  ? ? ?General appearance: alert, cooperative and appears stated age ?Head: Normocephalic, without obvious abnormality, atraumatic ?Neck: no adenopathy, supple, symmetrical, trachea midline and thyroid normal to inspection and palpation ?Lungs:  clear to auscultation bilaterally ?Cardiovascular: regular rate and rhythm ?Breasts: normal appearance, no masses or tenderness, she has an erythematous rash under both breasts ?Abdomen: soft, non-tender; non distended,  no masses,  no organomegaly ?Extremities: extremities normal, atraumatic, no cyanosis or edema ?Skin: Skin color, texture, turgor normal. No rashes or lesions ?Lymph nodes: Cervical, supraclavicular, and axillary nodes normal. ?No abnormal inguinal nodes palpated ?Neurologic: Grossly normal ? ? ?Pelvic: External genitalia:  no lesions ?             Urethra:  normal appearing urethra with no masses, tenderness or lesions ?             Bartholins and Skenes: normal    ?             Vagina: normal appearing  vagina with normal color and discharge, no lesions ?             Cervix: no lesions ?              ?Bimanual Exam:  Uterus:   no masses or tenderness ?             Adnexa: no mass, fullness, tenderness ?              Rectovaginal: Confirms ?              Anus:  normal sphincter tone, no lesions ? ?Lovena Le, CMA chaperoned for the exam. ? ?1. Well woman exam ?Discussed breast self exam ?Discussed calcium and vit D intake ? ?2. PCOS (polycystic ovarian syndrome) ?On OCP's for contraception and endometrial protection ? ?3. History of anemia ?- CBC ?- Ferritin ? ?4. Prediabetes ?- Hemoglobin A1c ? ?5. Laboratory exam ordered as part of routine general medical examination ?- CBC ?- Comprehensive metabolic panel ?- Lipid panel ? ?6. Encounter for surveillance of contraceptive pills ?- norethindrone-ethinyl estradiol-FE (LOESTRIN FE) 1-20 MG-MCG tablet; Take 1 tablet by mouth daily. Skip placebo pills and take continuously  Dispense: 112 tablet; Refill: 3 ? ?7. Elevated LDL cholesterol level ?- Lipid panel ? ?8. Screening for cervical cancer ?- Cytology - PAP ? ?9. Hair loss ?- TSH ? ?10. Candidal intertrigo ?- nystatin cream (MYCOSTATIN); Apply 1 application topically 2 (two) times daily. Apply to affected area BID for up to 7 days.  Dispense: 30 g; Refill: 1 ? ?Addendum: she has cystic acne on her face, # for Dermatology given ?

## 2022-01-17 ENCOUNTER — Ambulatory Visit (INDEPENDENT_AMBULATORY_CARE_PROVIDER_SITE_OTHER): Payer: 59 | Admitting: Obstetrics and Gynecology

## 2022-01-17 ENCOUNTER — Encounter: Payer: Self-pay | Admitting: Obstetrics and Gynecology

## 2022-01-17 ENCOUNTER — Other Ambulatory Visit: Payer: Self-pay

## 2022-01-17 ENCOUNTER — Other Ambulatory Visit (HOSPITAL_COMMUNITY)
Admission: RE | Admit: 2022-01-17 | Discharge: 2022-01-17 | Disposition: A | Discharge status: Home / Self Care | Payer: 59 | Source: Ambulatory Visit | Attending: Obstetrics and Gynecology | Admitting: Obstetrics and Gynecology

## 2022-01-17 VITALS — BP 110/78 | HR 78 | Ht 66.0 in | Wt 229.0 lb

## 2022-01-17 DIAGNOSIS — Z0000 Encounter for general adult medical examination without abnormal findings: Secondary | ICD-10-CM

## 2022-01-17 DIAGNOSIS — E282 Polycystic ovarian syndrome: Secondary | ICD-10-CM | POA: Diagnosis not present

## 2022-01-17 DIAGNOSIS — L659 Nonscarring hair loss, unspecified: Secondary | ICD-10-CM

## 2022-01-17 DIAGNOSIS — Z124 Encounter for screening for malignant neoplasm of cervix: Secondary | ICD-10-CM

## 2022-01-17 DIAGNOSIS — Z01419 Encounter for gynecological examination (general) (routine) without abnormal findings: Secondary | ICD-10-CM | POA: Diagnosis not present

## 2022-01-17 DIAGNOSIS — Z3041 Encounter for surveillance of contraceptive pills: Secondary | ICD-10-CM

## 2022-01-17 DIAGNOSIS — R7303 Prediabetes: Secondary | ICD-10-CM | POA: Diagnosis not present

## 2022-01-17 DIAGNOSIS — Z862 Personal history of diseases of the blood and blood-forming organs and certain disorders involving the immune mechanism: Secondary | ICD-10-CM

## 2022-01-17 DIAGNOSIS — B372 Candidiasis of skin and nail: Secondary | ICD-10-CM

## 2022-01-17 DIAGNOSIS — E7800 Pure hypercholesterolemia, unspecified: Secondary | ICD-10-CM

## 2022-01-17 MED ORDER — NORETHIN ACE-ETH ESTRAD-FE 1-20 MG-MCG PO TABS: 1.0000 | ORAL_TABLET | Freq: Every day | ORAL | 3 refills | Status: DC

## 2022-01-17 MED ORDER — NYSTATIN 100000 UNIT/GM EX CREA: 1.0000 "application " | TOPICAL_CREAM | Freq: Two times a day (BID) | CUTANEOUS | 1 refills | Status: AC

## 2022-01-17 NOTE — Patient Instructions (Signed)

## 2022-01-18 LAB — CYTOLOGY - PAP: Diagnosis: NEGATIVE

## 2022-01-18 LAB — COMPREHENSIVE METABOLIC PANEL
AG Ratio: 1.4 (calc) (ref 1.0–2.5)
ALT: 12 U/L (ref 6–29)
AST: 13 U/L (ref 10–30)
Albumin: 4.1 g/dL (ref 3.6–5.1)
Alkaline phosphatase (APISO): 59 U/L (ref 31–125)
BUN: 12 mg/dL (ref 7–25)
CO2: 26 mmol/L (ref 20–32)
Calcium: 9.4 mg/dL (ref 8.6–10.2)
Chloride: 109 mmol/L (ref 98–110)
Creat: 0.76 mg/dL (ref 0.50–0.96)
Globulin: 2.9 g/dL (calc) (ref 1.9–3.7)
Glucose, Bld: 90 mg/dL (ref 65–99)
Potassium: 4.2 mmol/L (ref 3.5–5.3)
Sodium: 141 mmol/L (ref 135–146)
Total Bilirubin: 0.3 mg/dL (ref 0.2–1.2)
Total Protein: 7 g/dL (ref 6.1–8.1)

## 2022-01-18 LAB — TSH: TSH: 1.73 mIU/L

## 2022-01-18 LAB — LIPID PANEL
Cholesterol: 181 mg/dL (ref <200)
HDL: 39 mg/dL — ABNORMAL LOW (ref >=50)
LDL Cholesterol (Calc): 120 mg/dL (calc) — ABNORMAL HIGH
Non-HDL Cholesterol (Calc): 142 mg/dL (calc) — ABNORMAL HIGH (ref <130)
Total CHOL/HDL Ratio: 4.6 (calc) (ref <5.0)
Triglycerides: 117 mg/dL (ref <150)

## 2022-01-18 LAB — CBC
HCT: 31.8 % — ABNORMAL LOW (ref 35.0–45.0)
Hemoglobin: 9.3 g/dL — ABNORMAL LOW (ref 11.7–15.5)
MCH: 19.6 pg — ABNORMAL LOW (ref 27.0–33.0)
MCHC: 29.2 g/dL — ABNORMAL LOW (ref 32.0–36.0)
MCV: 66.9 fL — ABNORMAL LOW (ref 80.0–100.0)
MPV: 10.6 fL (ref 7.5–12.5)
Platelets: 359 10*3/uL (ref 140–400)
RBC: 4.75 10*6/uL (ref 3.80–5.10)
RDW: 19.1 % — ABNORMAL HIGH (ref 11.0–15.0)
WBC: 4.6 10*3/uL (ref 3.8–10.8)

## 2022-01-18 LAB — HEMOGLOBIN A1C
Hgb A1c MFr Bld: 5.9 % of total Hgb — ABNORMAL HIGH (ref <5.7)
Mean Plasma Glucose: 123 mg/dL
eAG (mmol/L): 6.8 mmol/L

## 2022-01-18 LAB — FERRITIN: Ferritin: 2 ng/mL — ABNORMAL LOW (ref 16–154)

## 2022-01-23 ENCOUNTER — Other Ambulatory Visit: Payer: Self-pay

## 2022-01-23 ENCOUNTER — Telehealth: Payer: Self-pay

## 2022-01-23 DIAGNOSIS — N939 Abnormal uterine and vaginal bleeding, unspecified: Secondary | ICD-10-CM

## 2022-01-23 DIAGNOSIS — D500 Iron deficiency anemia secondary to blood loss (chronic): Secondary | ICD-10-CM

## 2022-01-23 NOTE — Telephone Encounter (Signed)
Please let the patient know that she continues to be very anemic with low iron stores. I recommend that she see Hematology for a consultation and iron transfusion.  ? ?

## 2022-01-23 NOTE — Telephone Encounter (Signed)
Order placed in Epic.

## 2022-01-28 ENCOUNTER — Telehealth: Payer: Self-pay | Admitting: Hematology and Oncology

## 2022-01-28 NOTE — Telephone Encounter (Signed)
Scheduled appt per 3/8 referral. Pt is aware of appt date and time. Pt is aware to arrive 15 mins prior to appt time and to bring and updated insurance card. Pt is aware of appt location.   ?

## 2022-01-30 NOTE — Telephone Encounter (Signed)
Patient is scheduled with Hematology on 02/08/22. ?

## 2022-02-07 NOTE — Progress Notes (Incomplete)
[facility] ?CONSULT NOTE ? ?Patient Care Team: ?Patient, No Pcp Per (Inactive) as PCP - General (General Practice) ?Romualdo Bolk, MD as Consulting Physician (Obstetrics and Gynecology) ?Magrinat, Valentino Hue, MD (Inactive) as Consulting Physician (Hematology and Oncology) ? ?CHIEF COMPLAINTS/PURPOSE OF CONSULTATION:  ?Iron Deficiency ? ?HISTORY OF PRESENTING ILLNESS:  ?Tiffany Mason 26 y.o. female is here because of recent diagnosis of Iron Deficiency. She presents to the clinic today for consult and labs.  ? ?I reviewed her records extensively and collaborated the history with the patient. ? ?SUMMARY OF ONCOLOGIC HISTORY: ?Oncology History  ? No history exists.  ? ? ? ?MEDICAL HISTORY:  ?Past Medical History:  ?Diagnosis Date  ? Abnormal uterine bleeding   ? Hirsutism   ? History of anemia   ? Hormone disorder   ? PCOS (polycystic ovarian syndrome)   ? Prediabetes   ? ? ?SURGICAL HISTORY: ?No past surgical history on file. ? ?SOCIAL HISTORY: ?Social History  ? ?Socioeconomic History  ? Marital status: Single  ?  Spouse name: Not on file  ? Number of children: Not on file  ? Years of education: Not on file  ? Highest education level: Not on file  ?Occupational History  ? Not on file  ?Tobacco Use  ? Smoking status: Never  ? Smokeless tobacco: Never  ?Vaping Use  ? Vaping Use: Never used  ?Substance and Sexual Activity  ? Alcohol use: Not Currently  ? Drug use: Not Currently  ? Sexual activity: Yes  ?  Birth control/protection: Condom  ?Other Topics Concern  ? Not on file  ?Social History Narrative  ? Not on file  ? ?Social Determinants of Health  ? ?Financial Resource Strain: Not on file  ?Food Insecurity: Not on file  ?Transportation Needs: Not on file  ?Physical Activity: Not on file  ?Stress: Not on file  ?Social Connections: Not on file  ?Intimate Partner Violence: Not on file  ? ? ?FAMILY HISTORY: ?Family History  ?Problem Relation Age of Onset  ? Diabetes Father   ? ? ?ALLERGIES:  is  allergic to fish allergy. ? ?MEDICATIONS:  ?Current Outpatient Medications  ?Medication Sig Dispense Refill  ? ibuprofen (ADVIL) 200 MG tablet Take 400 mg by mouth every 6 (six) hours as needed for mild pain.    ? naproxen (NAPROSYN) 375 MG tablet Take 1 tablet (375 mg total) by mouth 2 (two) times daily with a meal. 20 tablet 0  ? norethindrone-ethinyl estradiol-FE (LOESTRIN FE) 1-20 MG-MCG tablet Take 1 tablet by mouth daily. Skip placebo pills and take continuously 112 tablet 3  ? nystatin cream (MYCOSTATIN) Apply 1 application topically 2 (two) times daily. Apply to affected area BID for up to 7 days. 30 g 1  ? valACYclovir (VALTREX) 500 MG tablet Take one tablet daily, increase to one tablet bid x 3 days as needed. 90 tablet 1  ? ?No current facility-administered medications for this visit.  ? ? ?REVIEW OF SYSTEMS:   ?Constitutional: Denies fevers, chills or abnormal night sweats ?Eyes: Denies blurriness of vision, double vision or watery eyes ?Ears, nose, mouth, throat, and face: Denies mucositis or sore throat ?Respiratory: Denies cough, dyspnea or wheezes ?Cardiovascular: Denies palpitation, chest discomfort or lower extremity swelling ?Gastrointestinal:  Denies nausea, heartburn or change in bowel habits ?Skin: Denies abnormal skin rashes ?Lymphatics: Denies new lymphadenopathy or easy bruising ?Neurological:Denies numbness, tingling or new weaknesses ?Behavioral/Psych: Mood is stable, no new changes  ?Breast: *** Denies any palpable lumps  or discharge ?All other systems were reviewed with the patient and are negative. ? ?PHYSICAL EXAMINATION: ?ECOG PERFORMANCE STATUS: {CHL ONC ECOG AY:[id number]} ? ?There were no vitals filed for this visit. ?There were no vitals filed for this visit. ? ?GENERAL:alert, no distress and comfortable ?SKIN: skin color, texture, turgor are normal, no rashes or significant lesions ?EYES: normal, conjunctiva are pink and non-injected, sclera clear ?OROPHARYNX:no exudate, no  erythema and lips, buccal mucosa, and tongue normal  ?NECK: supple, thyroid normal size, non-tender, without nodularity ?LYMPH:  no palpable lymphadenopathy in the cervical, axillary or inguinal ?LUNGS: clear to auscultation and percussion with normal breathing effort ?HEART: regular rate & rhythm and no murmurs and no lower extremity edema ?ABDOMEN:abdomen soft, non-tender and normal bowel sounds ?Musculoskeletal:no cyanosis of digits and no clubbing  ?PSYCH: alert & oriented x 3 with fluent speech ?NEURO: no focal motor/sensory deficits ? ?LABORATORY DATA:  ?I have reviewed the data as listed ?Lab Results  ?Component Value Date  ? WBC 4.6 01/17/2022  ? HGB 9.3 (L) 01/17/2022  ? HCT 31.8 (L) 01/17/2022  ? MCV 66.9 (L) 01/17/2022  ? PLT 359 01/17/2022  ? ?Lab Results  ?Component Value Date  ? NA 141 01/17/2022  ? K 4.2 01/17/2022  ? CL 109 01/17/2022  ? CO2 26 01/17/2022  ? ? ?RADIOGRAPHIC STUDIES: ?I have personally reviewed the radiological reports and agreed with the findings in the report. ? ?ASSESSMENT AND PLAN:  ?No problem-specific Assessment & Plan notes found for this encounter. ? ? ?All questions were answered. The patient knows to call the clinic with any problems, questions or concerns. ?  ? Sherlyn Lick, CMA ?02/07/22 ? ?I Janan Ridge am scribing for Dr. Pamelia Hoit ? ?***  ?  ? ?

## 2022-02-08 ENCOUNTER — Other Ambulatory Visit: Payer: 59

## 2022-02-08 ENCOUNTER — Inpatient Hospital Stay: Payer: 59 | Attending: Hematology and Oncology | Admitting: Hematology and Oncology

## 2022-02-08 DIAGNOSIS — D500 Iron deficiency anemia secondary to blood loss (chronic): Secondary | ICD-10-CM | POA: Insufficient documentation

## 2022-02-08 NOTE — Assessment & Plan Note (Deleted)
Lab review ?08/06/2019: Hemoglobin 9, MCV 72, RDW 19.3, platelets 388, ferritin 10 (ferritin 7 in August 2019) ?01/17/2022: Hemoglobin 9.3, MCV 66.9, RDW 19.1, platelets 359, ferritin 2 ? ?Iron deficiency anemia: ?I counseled extensively regarding the different causes of iron deficiency including blood loss and malabsorption. ?Patient has heavy menstrual cycles ? ?Recommendation: ?1. Stop oral iron ?2. proceed with IV iron infusions ? ?I discussed with the patient that potentially there may be a need for additional IV iron infusions if the iron levels were to remain low in the future. The frequency of need of IV iron would depend on many other factors including the rate of loss and by the degree of absorption. ? ?Return to clinic in 3 months with recheck on iron studies and hemoglobin. ? ? ?

## 2022-02-26 ENCOUNTER — Other Ambulatory Visit: Payer: 59 | Admitting: Obstetrics and Gynecology

## 2022-02-26 ENCOUNTER — Other Ambulatory Visit: Payer: 59

## 2022-02-26 DIAGNOSIS — Z0289 Encounter for other administrative examinations: Secondary | ICD-10-CM

## 2022-02-26 NOTE — Progress Notes (Deleted)
GYNECOLOGY  VISIT ?  ?HPI: ?26 y.o.   Single Black or African American Not Hispanic or Latino  female   ?G0P0000 with No LMP recorded.   ?here for evaluation of heavy cycles on OCP's and iron deficiency anemia. She takes the pills continuously and only takes a break if she has BTB. When she has a cycle she bleeds for 7 days and can saturate a pad in 2 hours. She states that her cycles are better on pills than prior to the pill. ?At the time of her annual exam last month her hgb was 9.3 and her ferritin was 2. She was scheduled to go to Hematology on 02/08/22 but didn't go. She has missed prior Hematology appointments as well.  ? ?In the past she was diagnosed with PCOS and anovulation.  ? ?Pap 01/17/22 normal.  ? ?GYNECOLOGIC HISTORY: ?No LMP recorded. ?Contraception: OCP's ?Menopausal hormone therapy: none ?       ?OB History   ? ? Gravida  ?0  ? Para  ?0  ? Term  ?0  ? Preterm  ?0  ? AB  ?0  ? Living  ?0  ?  ? ? SAB  ?0  ? IAB  ?0  ? Ectopic  ?0  ? Multiple  ?0  ? Live Births  ?0  ?   ?  ?  ?    ? ?Patient Active Problem List  ? Diagnosis Date Noted  ? Iron deficiency anemia due to chronic blood loss 02/08/2022  ? Prediabetes   ? PCOS (polycystic ovarian syndrome)   ? Anemia 06/24/2018  ? Menometrorrhagia 06/23/2018  ? Severe anemia 06/23/2018  ? Polycystic ovaries 04/29/2016  ? Obesity 04/12/2016  ? Amenorrhea 04/11/2016  ? Postcoital bleeding 04/11/2016  ? Vaginitis 04/11/2016  ? ? ?Past Medical History:  ?Diagnosis Date  ? Abnormal uterine bleeding   ? Hirsutism   ? History of anemia   ? Hormone disorder   ? PCOS (polycystic ovarian syndrome)   ? Prediabetes   ? ? ?No past surgical history on file. ? ?Current Outpatient Medications  ?Medication Sig Dispense Refill  ? ibuprofen (ADVIL) 200 MG tablet Take 400 mg by mouth every 6 (six) hours as needed for mild pain.    ? naproxen (NAPROSYN) 375 MG tablet Take 1 tablet (375 mg total) by mouth 2 (two) times daily with a meal. 20 tablet 0  ? norethindrone-ethinyl  estradiol-FE (LOESTRIN FE) 1-20 MG-MCG tablet Take 1 tablet by mouth daily. Skip placebo pills and take continuously 112 tablet 3  ? nystatin cream (MYCOSTATIN) Apply 1 application topically 2 (two) times daily. Apply to affected area BID for up to 7 days. 30 g 1  ? valACYclovir (VALTREX) 500 MG tablet Take one tablet daily, increase to one tablet bid x 3 days as needed. 90 tablet 1  ? ?No current facility-administered medications for this visit.  ?  ? ?ALLERGIES: Fish allergy ? ?Family History  ?Problem Relation Age of Onset  ? Diabetes Father   ? ? ?Social History  ? ?Socioeconomic History  ? Marital status: Single  ?  Spouse name: Not on file  ? Number of children: Not on file  ? Years of education: Not on file  ? Highest education level: Not on file  ?Occupational History  ? Not on file  ?Tobacco Use  ? Smoking status: Never  ? Smokeless tobacco: Never  ?Vaping Use  ? Vaping Use: Never used  ?Substance and Sexual Activity  ?  Alcohol use: Not Currently  ? Drug use: Not Currently  ? Sexual activity: Yes  ?  Birth control/protection: Condom  ?Other Topics Concern  ? Not on file  ?Social History Narrative  ? Not on file  ? ?Social Determinants of Health  ? ?Financial Resource Strain: Not on file  ?Food Insecurity: Not on file  ?Transportation Needs: Not on file  ?Physical Activity: Not on file  ?Stress: Not on file  ?Social Connections: Not on file  ?Intimate Partner Violence: Not on file  ? ? ?ROS ? ?PHYSICAL EXAMINATION:   ? ?There were no vitals taken for this visit.    ?General appearance: alert, cooperative and appears stated age ?Neck: no adenopathy, supple, symmetrical, trachea midline and thyroid {CHL AMB PHY EX THYROID NORM DEFAULT:[phone]::"normal to inspection and palpation"} ?Breasts: {Exam; breast:13139::"normal appearance, no masses or tenderness"} ?Abdomen: soft, non-tender; non distended, no masses,  no organomegaly ? ?Pelvic: External genitalia:  no lesions ?             Urethra:  normal  appearing urethra with no masses, tenderness or lesions ?             Bartholins and Skenes: normal    ?             Vagina: normal appearing vagina with normal color and discharge, no lesions ?             Cervix: {CHL AMB PHY EX CERVIX NORM DEFAULT:[phone]::"no lesions"} ?             Bimanual Exam:  Uterus:  {CHL AMB PHY EX UTERUS NORM [phone]::"normal size, contour, position, consistency, mobility, non-tender"} ?             Adnexa: {CHL AMB PHY EX ADNEXA NO MASS DEFAULT:[phone]::"no mass, fullness, tenderness"} ?             Rectovaginal: {yes no:[id number]}.  Confirms. ?             Anus:  normal sphincter tone, no lesions ? ?Chaperone was present for exam. ? ?ASSESSMENT ? ?  ? ?PLAN ? ?  ?An After Visit Summary was printed and given to the patient. ? ?*** minutes face to face time of which over 50% was spent in counseling.  ? ? ? ?

## 2022-03-04 ENCOUNTER — Other Ambulatory Visit: Payer: Self-pay | Admitting: Obstetrics and Gynecology

## 2022-03-04 DIAGNOSIS — B372 Candidiasis of skin and nail: Secondary | ICD-10-CM

## 2022-03-04 NOTE — Telephone Encounter (Signed)
Pt called to inquire about Rx refill request. Pt notified it has been sent to provider for authorization. Pt voiced understanding.  ?

## 2022-03-04 NOTE — Telephone Encounter (Signed)
Last AEX- 01/17/22.  ?

## 2022-05-29 ENCOUNTER — Telehealth: Payer: Self-pay

## 2022-05-29 ENCOUNTER — Other Ambulatory Visit: Payer: Self-pay

## 2022-05-29 DIAGNOSIS — N926 Irregular menstruation, unspecified: Secondary | ICD-10-CM

## 2022-05-29 NOTE — Telephone Encounter (Signed)
Please schedule her for an ultrasound and visit with me.

## 2022-05-29 NOTE — Telephone Encounter (Signed)
Spoke with patient and informed her. Order placed and message sent to appt desk to call her to schedule.

## 2022-05-29 NOTE — Telephone Encounter (Signed)
02/07/22 AEX "6. Encounter for surveillance of contraceptive pills - norethindrone-ethinyl estradiol-FE (LOESTRIN FE) 1-20 MG-MCG tablet; Take 1 tablet by mouth daily. Skip placebo pills and take continuously."  Patient called in voice mail asking if she could get Rx for a "stronger birth control pill" and c/o "bleeding constantly".  I called and spoke with her. She relays that she takes daily and bleeding will start light then get heavy. At that point she starts taking 3 birth control pills a day.  She said usually after about 3 days the bleeding stops but as soon as she starts back on one daily it starts again.   She said she even tried just stopping them but she bleeds and it does not end.  I advised her that Dr. Oscar La may recommend OV but I will run it by her first.

## 2022-05-29 NOTE — Telephone Encounter (Signed)
Ultrasound and visit scheduled for 06/27/22.

## 2022-06-13 ENCOUNTER — Telehealth: Payer: Self-pay

## 2022-06-13 NOTE — Telephone Encounter (Signed)
Spoke with patient. Patient expressed that she had a PUS 2 years ago. Advised per Dr. Oscar La and importance of ultrasound evaluation and changes that can occur over the course of 2 years.  Reviewed ultrasound scheduling options with patient to meet scheduling and financial needs.  Patient declines PUS at this time. Patient states she will need to consider options and return call if she decides to proceed with scheduling PUS. Patient verbalizes understanding.   Routing to Dr. Shirley Friar.

## 2022-06-13 NOTE — Telephone Encounter (Signed)
Please reach out to the patient. She really should be evaluated for her AUB. Can you please discuss her concerns and try to come up with a solution. Thanks

## 2022-06-13 NOTE — Telephone Encounter (Signed)
01/18/22 lab result note Dr. Oscar La wrote "It not normal that we can't control her bleeding and anemia with OCP's, please set her up for a pelvic ultrasound and f/u visit with me. " She was scheduled for this on 02/26/22 and was no show and r/s to 06/27/22 and has cancelled.  Cancel Rsn: Patient (patient called to cancel appt due to financials)    Patient called today in voice mail asking about changing her pill.   Patient taking bcp continuously.  Reports she bleeds every day on bcp. Only way she doesn't bleed if she takes multiple pills in a day and it will stop for several days. She stopped taking if for a break and is bleeding heavier now like a period.  Patient questions instead of restarting the same bcp if perhaps she can try something different.

## 2022-06-27 ENCOUNTER — Other Ambulatory Visit: Payer: 59

## 2022-06-27 ENCOUNTER — Other Ambulatory Visit: Payer: 59 | Admitting: Obstetrics and Gynecology

## 2023-02-04 ENCOUNTER — Ambulatory Visit: Payer: 59 | Admitting: Obstetrics and Gynecology

## 2023-04-01 ENCOUNTER — Other Ambulatory Visit: Payer: Self-pay

## 2023-04-01 DIAGNOSIS — B372 Candidiasis of skin and nail: Secondary | ICD-10-CM

## 2023-04-01 MED ORDER — VALACYCLOVIR HCL 500 MG PO TABS
ORAL_TABLET | ORAL | 0 refills | Status: AC
Start: 2023-04-01 — End: ?

## 2023-04-01 NOTE — Telephone Encounter (Signed)
Medication refill request: Valtrex 500mg  Last AEX:  01/17/22 Next AEX: 04/08/23 Last MMG (if hormonal medication request): n/a Refill authorized: #90 with 1 rf

## 2023-04-03 ENCOUNTER — Other Ambulatory Visit: Payer: Self-pay

## 2023-04-03 DIAGNOSIS — Z3041 Encounter for surveillance of contraceptive pills: Secondary | ICD-10-CM

## 2023-04-03 MED ORDER — NORETHIN ACE-ETH ESTRAD-FE 1-20 MG-MCG PO TABS
1.0000 | ORAL_TABLET | Freq: Every day | ORAL | 0 refills | Status: AC
Start: 2023-04-03 — End: ?

## 2023-04-03 NOTE — Telephone Encounter (Signed)
Med refill request: Last AEX: 01/17/22 Next AEX: 04/08/23 Last MMG (if hormonal med) Refill authorized: Please Advise?

## 2023-04-07 NOTE — Progress Notes (Deleted)
27 y.o. G0P0000 Single Black or African American Not Hispanic or Latino female here for annual exam.      No LMP recorded.          Sexually active: {yes no:314532}  The current method of family planning is {contraception:315051}.    Exercising: {yes no:314532}  {types:19826} Smoker:  {YES [id number]  Health Maintenance: Pap:  01/17/22 wnl 06/04/2018 WNL History of abnormal Pap:  no  MMG:  n/a BMD:   n/a Colonoscopy: n/a TDaP:  utd per patient  Gardasil: complete per patient    reports that she has never smoked. She has never used smokeless tobacco. She reports that she does not currently use alcohol. She reports that she does not currently use drugs.  Past Medical History:  Diagnosis Date   Abnormal uterine bleeding    Hirsutism    History of anemia    Hormone disorder    PCOS (polycystic ovarian syndrome)    Prediabetes     No past surgical history on file.  Current Outpatient Medications  Medication Sig Dispense Refill   ibuprofen (ADVIL) 200 MG tablet Take 400 mg by mouth every 6 (six) hours as needed for mild pain.     naproxen (NAPROSYN) 375 MG tablet Take 1 tablet (375 mg total) by mouth 2 (two) times daily with a meal. 20 tablet 0   norethindrone-ethinyl estradiol-FE (LOESTRIN FE) 1-20 MG-MCG tablet Take 1 tablet by mouth daily. Skip placebo pills and take continuously 28 tablet 0   nystatin cream (MYCOSTATIN) Apply 1 application topically 2 (two) times daily. Apply to affected area BID for up to 7 days. 30 g 1   valACYclovir (VALTREX) 500 MG tablet TAKE 1 TABLET BY MOUTH DAILY. INCREASE TO 1 TABLET 2 TIMES DAILY FOR 3 DAYS AS NEEDED 90 tablet 0   No current facility-administered medications for this visit.    Family History  Problem Relation Age of Onset   Diabetes Father     Review of Systems  Exam:   There were no vitals taken for this visit.  Weight change: @WEIGHTCHANGE @ Height:      Ht Readings from Last 3 Encounters:  01/17/22 5\' 6"  (1.676 m)  10/09/20  5\' 6"  (1.676 m)  06/21/20 5' 6.25" (1.683 m)    General appearance: alert, cooperative and appears stated age Head: Normocephalic, without obvious abnormality, atraumatic Neck: no adenopathy, supple, symmetrical, trachea midline and thyroid {CHL AMB PHY EX THYROID NORM DEFAULT:[phone]::"normal to inspection and palpation"} Lungs: clear to auscultation bilaterally Cardiovascular: regular rate and rhythm Breasts: {Exam; breast:13139::"normal appearance, no masses or tenderness"} Abdomen: soft, non-tender; non distended,  no masses,  no organomegaly Extremities: extremities normal, atraumatic, no cyanosis or edema Skin: Skin color, texture, turgor normal. No rashes or lesions Lymph nodes: Cervical, supraclavicular, and axillary nodes normal. No abnormal inguinal nodes palpated Neurologic: Grossly normal   Pelvic: External genitalia:  no lesions              Urethra:  normal appearing urethra with no masses, tenderness or lesions              Bartholins and Skenes: normal                 Vagina: normal appearing vagina with normal color and discharge, no lesions              Cervix: {CHL AMB PHY EX CERVIX NORM [phone]::"no lesions"}  Bimanual Exam:  Uterus:  {CHL AMB PHY EX UTERUS NORM [phone]::"normal size, contour, position, consistency, mobility, non-tender"}              Adnexa: {CHL AMB PHY EX ADNEXA NO MASS DEFAULT:[phone]::"no mass, fullness, tenderness"}               Rectovaginal: Confirms               Anus:  normal sphincter tone, no lesions  *** chaperoned for the exam.  A:  Well Woman with normal exam  P:

## 2023-04-08 ENCOUNTER — Ambulatory Visit: Payer: 59 | Admitting: Obstetrics and Gynecology

## 2023-09-02 ENCOUNTER — Other Ambulatory Visit: Payer: Self-pay

## 2023-09-02 DIAGNOSIS — B372 Candidiasis of skin and nail: Secondary | ICD-10-CM

## 2023-09-02 NOTE — Telephone Encounter (Signed)
Med refill request: Valtrex Last AEX: 01/17/22 Next AEX: no 02/04/23 and 04/08/23 Last MMG (if hormonal med) n/a Refill authorized: Please Advise, #90, 0 RF
